# Patient Record
Sex: Male | Born: 1935 | Race: White | Hispanic: No | Marital: Married | Smoking: Former smoker
Health system: Southern US, Community
[De-identification: ages and names within clinical notes are randomized; demographics above are authoritative.]

## PROBLEM LIST (undated history)

## (undated) DIAGNOSIS — N419 Inflammatory disease of prostate, unspecified: Secondary | ICD-10-CM

## (undated) DIAGNOSIS — M109 Gout, unspecified: Secondary | ICD-10-CM

## (undated) DIAGNOSIS — R972 Elevated prostate specific antigen [PSA]: Secondary | ICD-10-CM

## (undated) DIAGNOSIS — I10 Essential (primary) hypertension: Secondary | ICD-10-CM

## (undated) DIAGNOSIS — C61 Malignant neoplasm of prostate: Secondary | ICD-10-CM

## (undated) DIAGNOSIS — M0520 Rheumatoid vasculitis with rheumatoid arthritis of unspecified site: Secondary | ICD-10-CM

## (undated) HISTORY — PX: HEMORROIDECTOMY: SUR656

## (undated) HISTORY — DX: Rheumatoid vasculitis with rheumatoid arthritis of unspecified site: M05.20

## (undated) HISTORY — PX: PROSTATE BIOPSY: SHX241

## (undated) HISTORY — DX: Inflammatory disease of prostate, unspecified: N41.9

## (undated) HISTORY — DX: Gout, unspecified: M10.9

## (undated) HISTORY — DX: Elevated prostate specific antigen (PSA): R97.20

## (undated) HISTORY — DX: Malignant neoplasm of prostate: C61

---

## 2004-06-08 HISTORY — PX: PROSTATE CRYOABLATION: SUR358

## 2005-10-01 ENCOUNTER — Other Ambulatory Visit: Payer: Self-pay

## 2005-10-01 ENCOUNTER — Ambulatory Visit: Payer: Self-pay | Admitting: Urology

## 2005-10-13 ENCOUNTER — Ambulatory Visit: Payer: Self-pay | Admitting: Urology

## 2006-06-07 ENCOUNTER — Ambulatory Visit: Payer: Self-pay | Admitting: Gastroenterology

## 2009-05-07 ENCOUNTER — Ambulatory Visit: Payer: Self-pay | Admitting: Internal Medicine

## 2011-06-03 ENCOUNTER — Ambulatory Visit: Payer: Self-pay | Admitting: Internal Medicine

## 2011-06-21 ENCOUNTER — Emergency Department: Payer: Self-pay | Admitting: *Deleted

## 2011-06-21 LAB — CBC WITH DIFFERENTIAL/PLATELET
Basophil #: 0 10*3/uL (ref 0.0–0.1)
Basophil %: 0.4 %
HGB: 15.6 g/dL (ref 13.0–18.0)
Lymphocyte #: 1.2 10*3/uL (ref 1.0–3.6)
MCH: 33.7 pg (ref 26.0–34.0)
MCV: 98 fL (ref 80–100)
Monocyte %: 6.5 %
Platelet: 177 10*3/uL (ref 150–440)
RBC: 4.64 10*6/uL (ref 4.40–5.90)
RDW: 12.9 % (ref 11.5–14.5)
WBC: 9.6 10*3/uL (ref 3.8–10.6)

## 2011-06-21 LAB — APTT: Activated PTT: 29.2 secs (ref 23.6–35.9)

## 2012-05-04 ENCOUNTER — Ambulatory Visit: Payer: Self-pay | Admitting: Ophthalmology

## 2012-09-30 ENCOUNTER — Ambulatory Visit: Payer: Self-pay | Admitting: Family Medicine

## 2014-04-20 ENCOUNTER — Ambulatory Visit: Payer: Self-pay

## 2014-04-21 ENCOUNTER — Ambulatory Visit: Payer: Self-pay | Admitting: Family Medicine

## 2014-04-21 ENCOUNTER — Ambulatory Visit: Payer: Self-pay

## 2014-04-21 LAB — CLOSTRIDIUM DIFFICILE(ARMC)

## 2014-04-24 LAB — STOOL CULTURE

## 2014-06-27 ENCOUNTER — Ambulatory Visit: Payer: Self-pay | Admitting: Urology

## 2014-09-30 NOTE — Op Note (Signed)
PATIENT NAME:  Jeremiah Lewis, Jeremiah Lewis MR#:  [id number] DATE OF BIRTH:  [birth year]  DATE OF PROCEDURE:  06/21/2011  PREOPERATIVE DIAGNOSIS: Uncontrolled epistaxis.   POSTOPERATIVE DIAGNOSIS: Uncontrolled epistaxis.   OPERATIVE PROCEDURE: Nasal endoscopy and complicated control of epistaxis.   SURGEON: Huey Romans, MD   ANESTHESIA: Topical.   PROCEDURE: The patient was seen in the Emergency Room. He had a Rhino Rocket in his nose and was still draining around the Rhino rocket as well as draining down the back of his throat or coming out the right nostril. The patient was suctioned to try to assess the situation. There didn't appear to be active bleeding from the right side but just a drip is coming out as he leans his head forward. He is actively bleeding down the back of his throat. Rhino Rocket is removed. Suction is used and once I suctioned out all the blood from the left nostril you could see that there is active dripping coming from high in the nose, appears to be the anterior ethmoid running down along the medial side of the middle turbinate and then into the posterior pharynx. Cottonoid pledgets soaked in Afrin and lidocaine are used to pack the top of the nose, back of the nose. I packed this multiple times and finally seemed to get the bleeding settled down some and was not actively dripping anymore. After the third packing is removed, flexible endoscope was used for visualization of the nose. The left side shows old bleeding underneath the inferior turbinate but no active bleeding coming from there at all. The nasopharynx has just blood staining but no bleeding there. I do not see any lesions coming from superiorly in the nose and the groove between the middle turbinate and septum seems to be clear now and there is no active bleeding coming from that area. The right side shows septal spur and deviation of the right side where it is blocking much of the posterior nasal cavity and I am unable get  all the way through the nose on the right side. No sign of polyps or lesions on this side either. A Merocel sponge is then placed in the left nostril all the way to the nasopharynx. The Merocel is then soaked with Afrin and lidocaine to fill up the nasal cavity. This is clear. No sign of active bleeding at this point.    The patient tolerated the procedure well. This was done at the bedside in the Emergency Room. There were no operative complications.  ____________________________ Huey Romans, MD phj:drc D: 06/21/2011 04:12:53 ET T: 06/21/2011 10:57:05 ET JOB#: [id number]  cc: Huey Romans, MD, <Dictator> Huey Romans MD ELECTRONICALLY SIGNED 06/26/2011 20:27

## 2014-09-30 NOTE — Consult Note (Signed)
PATIENT NAME:  Jeremiah Lewis, Jeremiah Lewis MR#:  [id number] DATE OF BIRTH:  [birth year]  DATE OF CONSULTATION:  06/21/2011  REFERRING PHYSICIAN:   CONSULTING PHYSICIAN:  Huey Romans, MD  REASON FOR CONSULTATION: Uncontrolled epistaxis.   HISTORY OF PRESENT ILLNESS: The patient is a 81 year old white male who has been very healthy overall. He has never had any nosebleed problems. He woke up in the middle of the night and blew his nose and started getting bleeding from his left side. He has never had nosebleeds before. He came to the emergency room and this one has just not been able to be controlled well. He has been on a baby aspirin a day, but he is not on any other blood thinners. He says he has a history of some hypertension and his blood pressure was up for a little while but lately he has been checking it and it has been 120 to 130/70 generally at home. He is not on any medications for hypertension. He did have some flu last week, but it seems like that has gone away and he has been doing very well.   PAST MEDICAL HISTORY:  1. History of hypertension, although he has not been on medications and recent blood pressures at home have been stable.  2. History of glaucoma.  CURRENT MEDICATIONS: Baby aspirin a day.   DRUG ALLERGIES: No known drug allergies.  REVIEW OF SYSTEMS: He has not had any problems with bleeding anywhere else. No bruising at all. No swollen glands. No joint pain. No problems with his vision. He is not having any sore throat or cold right now. He has not been having any black stools or problems urinating. No chest pain and no shortness of breath. He has not been dizzy. No anxiety.   SOCIAL HISTORY: He is not a smoker. He lives at home.   FAMILY HISTORY: Negative for any kind of bleeding problems.     PHYSICAL EXAMINATION:   GENERAL: The patient is awake and alert, very cooperative.   HEENT: He has a Rhino Rocket in his left nostril and still actively bleeding around that and  he is spitting some blood out of his mouth. He is also having a little drip out of his right nostril. He has some fresh blood coming down the back of his throat. His oropharynx shows a good set of teeth. There is sign of fresh bleeding down the back of his throat.   NECK:  Negative with no nodes or masses.  HEART: Regular rate and rhythm without murmur.   VITALS: Blood pressure 283 systolic. Respiratory rate is normal.  LUNGS: Clear.   SKIN:  No skin rash.   ABDOMEN: Benign.   EXTREMITIES: Nondeformed.  PROCEDURE: The patient was seen at the bedside and control of the nosebleed was done by using Afrin and 4% lidocaine on cotton pledgets to find the bleeding site superiorly, at the anterior ethmoid, and then using the nasoendoscope to make sure there is no other bleeding sites, this was controlled and then a Merocel sponge was placed to help create some pressure in this area.   IMPRESSION AND RECOMMENDATIONS: The patient has had uncontrolled epistaxis and finally able to get it controlled.  He will be watched here for another hour, in the emergency room, and then sent home. We are awaiting lab work. He will rest at home and followup in the         office on Wednesday afternoon for removal of the sponge. He  will be put on some antibiotics to help prevent infection and he will hold his aspirin for at least the next week.  ____________________________ Huey Romans, MD phj:slb D: 06/21/2011 04:21:26 ET T: 06/21/2011 11:14:32 ET JOB#: [id number]  cc: Huey Romans, MD, <Dictator> Huey Romans MD ELECTRONICALLY SIGNED 06/26/2011 20:28

## 2015-01-23 DIAGNOSIS — R011 Cardiac murmur, unspecified: Secondary | ICD-10-CM | POA: Insufficient documentation

## 2015-01-23 DIAGNOSIS — I10 Essential (primary) hypertension: Secondary | ICD-10-CM | POA: Insufficient documentation

## 2015-04-07 ENCOUNTER — Ambulatory Visit: Payer: Medicare Other

## 2015-04-07 ENCOUNTER — Ambulatory Visit
Admission: EM | Admit: 2015-04-07 | Discharge: 2015-04-07 | Disposition: A | Discharge status: Home / Self Care | Payer: Medicare Other | Attending: Family Medicine | Admitting: Family Medicine

## 2015-04-07 ENCOUNTER — Encounter: Payer: Self-pay | Admitting: Gynecology

## 2015-04-07 ENCOUNTER — Other Ambulatory Visit: Payer: Self-pay

## 2015-04-07 DIAGNOSIS — Z7982 Long term (current) use of aspirin: Secondary | ICD-10-CM | POA: Diagnosis not present

## 2015-04-07 DIAGNOSIS — I10 Essential (primary) hypertension: Secondary | ICD-10-CM | POA: Insufficient documentation

## 2015-04-07 DIAGNOSIS — M25512 Pain in left shoulder: Secondary | ICD-10-CM | POA: Diagnosis present

## 2015-04-07 DIAGNOSIS — R079 Chest pain, unspecified: Secondary | ICD-10-CM

## 2015-04-07 DIAGNOSIS — I440 Atrioventricular block, first degree: Secondary | ICD-10-CM | POA: Insufficient documentation

## 2015-04-07 DIAGNOSIS — I493 Ventricular premature depolarization: Secondary | ICD-10-CM | POA: Diagnosis not present

## 2015-04-07 HISTORY — DX: Essential (primary) hypertension: I10

## 2015-04-07 NOTE — ED Notes (Signed)
Patient stated on and off pain  Started under his left axillary which rotate to his left  Posterior shoulder blade x 3 days.. Patient stated after burping and when walking felt better.

## 2015-04-07 NOTE — ED Provider Notes (Signed)
CSN: [id number]     Arrival date & time 04/07/15  1538 History   First MD Initiated Contact with Patient 04/07/15 1612     Chief Complaint  Patient presents with  . Arm Pain   (Consider location/radiation/quality/duration/timing/severity/associated sxs/prior Treatment) HPI Comments: Married caucasian male here for evaluation of left shoulder blade/armpit/chest pain.  Started a couple days ago thinks related to gas pains because he felt gas moving in belly earlier today took gas X and alkaseltzer and now it is gone and he can't get it to come back with arm movements that worsened pain previous couple of days.  Denied jaw pain, dyspnea, headache, rash, dizzyness, illness, trauma, working in yard  Retired  Blood pressure yesterday 131/67 when he checked  Patient is a 81 y.o. male presenting with arm pain. The history is provided by the patient.  Arm Pain This is a recurrent problem. The current episode started more than 2 days ago. The problem occurs daily. The problem has been resolved. Associated symptoms include chest pain. Pertinent negatives include no abdominal pain, no headaches and no shortness of breath. The symptoms are aggravated by exertion. Relieved by: meds. Treatments tried: alkaseltzer, gas x. The treatment provided significant relief.    Past Medical History  Diagnosis Date  . Hypertension   . Cancer [facility])     prostate   Past Surgical History  Procedure Laterality Date  . Prostate surgery    . Hemorroidectomy     History reviewed. No pertinent family history. Social History  Substance Use Topics  . Smoking status: Never Smoker   . Smokeless tobacco: None  . Alcohol Use: No    Review of Systems  Constitutional: Negative for fever, chills, diaphoresis, activity change, appetite change, fatigue and unexpected weight change.  HENT: Negative for congestion, dental problem, drooling, ear discharge, ear pain, facial swelling, hearing loss, mouth sores, nosebleeds,  postnasal drip, rhinorrhea, sinus pressure, sneezing, sore throat, tinnitus, trouble swallowing and voice change.   Eyes: Negative for photophobia, pain, discharge, redness, itching and visual disturbance.  Respiratory: Negative for cough, choking, chest tightness, shortness of breath, wheezing and stridor.   Cardiovascular: Positive for chest pain. Negative for palpitations and leg swelling.  Gastrointestinal: Negative for nausea, vomiting, abdominal pain, diarrhea, constipation, blood in stool and abdominal distention.  Endocrine: Negative for cold intolerance and heat intolerance.  Genitourinary: Negative for dysuria.  Musculoskeletal: Positive for myalgias and arthralgias. Negative for back pain, joint swelling, gait problem, neck pain and neck stiffness.  Skin: Negative for color change, pallor, rash and wound.  Allergic/Immunologic: Negative for environmental allergies and food allergies.  Neurological: Negative for dizziness, tremors, seizures, syncope, facial asymmetry, speech difficulty, weakness, light-headedness, numbness and headaches.  Hematological: Negative for adenopathy. Does not bruise/bleed easily.  Psychiatric/Behavioral: Positive for sleep disturbance. Negative for behavioral problems, confusion and agitation.    Allergies  Review of patient's allergies indicates no known allergies.  Home Medications   Prior to Admission medications   Medication Sig Start Date End Date Taking? Authorizing Provider  aspirin 81 MG tablet Take 81 mg by mouth daily.   Yes Historical Provider, MD  hydrochlorothiazide (HYDRODIURIL) 25 MG tablet Take 25 mg by mouth daily.   Yes Historical Provider, MD  indomethacin (INDOCIN) 25 MG capsule Take 25 mg by mouth 2 (two) times daily with a meal.   Yes Historical Provider, MD  latanoprost (XALATAN) 0.005 % ophthalmic solution 1 drop at bedtime.   Yes Historical Provider, MD  lisinopril (PRINIVIL,ZESTRIL) 40 MG  tablet Take 40 mg by mouth daily.   Yes  Historical Provider, MD   Meds Ordered and Administered this Visit  Medications - No data to display  BP 155/65 mmHg  Pulse 74  Temp(Src) 98.1 F (36.7 C) (Oral)  Resp 16  Ht 6\' 1"  (1.854 m)  Wt 196 lb (88.905 kg)  BMI 25.86 kg/m2  SpO2 100% No data found.   Physical Exam  Constitutional: He is oriented to person, place, and time. Vital signs are normal. He appears well-developed and well-nourished. He is active and cooperative.  Non-toxic appearance. He does not have a sickly appearance. He does not appear ill. No distress.  HENT:  Head: Normocephalic and atraumatic.  Right Ear: Hearing, tympanic membrane, external ear and ear canal normal.  Left Ear: Hearing, tympanic membrane, external ear and ear canal normal.  Nose: Nose normal. No mucosal edema or rhinorrhea. No epistaxis. Right sinus exhibits no maxillary sinus tenderness and no frontal sinus tenderness. Left sinus exhibits no maxillary sinus tenderness and no frontal sinus tenderness.  Mouth/Throat: Uvula is midline, oropharynx is clear and moist and mucous membranes are normal. Mucous membranes are not pale, not dry and not cyanotic. He does not have dentures. No oral lesions. No trismus in the jaw. Normal dentition. No dental abscesses, uvula swelling, lacerations or dental caries. No oropharyngeal exudate, posterior oropharyngeal edema, posterior oropharyngeal erythema or tonsillar abscesses.  Eyes: EOM and lids are normal. Pupils are equal, round, and reactive to light. Right eye exhibits no chemosis, no discharge, no exudate and no hordeolum. No foreign body present in the right eye. Left eye exhibits no chemosis, no discharge, no exudate and no hordeolum. No foreign body present in the left eye. Right conjunctiva is injected. Right conjunctiva has no hemorrhage. Left conjunctiva is injected. Left conjunctiva has no hemorrhage. No scleral icterus. Right eye exhibits normal extraocular motion and no nystagmus. Left eye exhibits  normal extraocular motion and no nystagmus. Right pupil is round and reactive. Left pupil is round and reactive. Pupils are equal.  Neck: Trachea normal and normal range of motion. Neck supple. No tracheal tenderness, no spinous process tenderness and no muscular tenderness present. No rigidity. No tracheal deviation, no edema, no erythema and normal range of motion present. No thyroid mass and no thyromegaly present.  Cardiovascular: Normal rate, regular rhythm, S1 normal, S2 normal, normal heart sounds and intact distal pulses.  PMI is not displaced.  Exam reveals no gallop and no friction rub.   No murmur heard. Pulses:      Radial pulses are 2+ on the right side, and 2+ on the left side.  Pulmonary/Chest: Effort normal and breath sounds normal. No accessory muscle usage or stridor. No respiratory distress. He has no decreased breath sounds. He has no wheezes. He has no rhonchi. He has no rales. He exhibits no tenderness.  Abdominal: Soft. Bowel sounds are normal. He exhibits no shifting dullness, no distension, no pulsatile liver, no fluid wave, no abdominal bruit, no ascites, no pulsatile midline mass and no mass. There is no hepatosplenomegaly. There is no tenderness. There is no rebound and no guarding. Hernia confirmed negative in the ventral area.  Musculoskeletal: He exhibits no edema or tenderness.       Right shoulder: He exhibits decreased range of motion and pain. He exhibits no tenderness, no bony tenderness, no swelling, no effusion, no crepitus, no deformity, no laceration, no spasm, normal pulse and normal strength.       Left shoulder:  Normal.       Right elbow: Normal.      Left elbow: Normal.       Right wrist: Normal.       Left wrist: Normal.       Right hip: Normal.       Left hip: Normal.       Right knee: Normal.       Left knee: Normal.       Right ankle: Normal.       Left ankle: Normal.       Cervical back: Normal.       Thoracic back: Normal.       Lumbar back:  Normal.       Right hand: Normal.       Left hand: Normal.  Lymphadenopathy:       Head (right side): No submental, no submandibular, no tonsillar, no preauricular, no posterior auricular and no occipital adenopathy present.       Head (left side): No submental, no submandibular, no tonsillar, no preauricular, no posterior auricular and no occipital adenopathy present.    He has no cervical adenopathy.       Right cervical: No superficial cervical, no deep cervical and no posterior cervical adenopathy present.      Left cervical: No superficial cervical, no deep cervical and no posterior cervical adenopathy present.  Neurological: He is alert and oriented to person, place, and time. He displays no atrophy and no tremor. No cranial nerve deficit or sensory deficit. He exhibits normal muscle tone. He displays no seizure activity. Coordination and gait normal. GCS eye subscore is 4. GCS verbal subscore is 5. GCS motor subscore is 6.  Reflex Scores:      Patellar reflexes are 2+ on the right side and 2+ on the left side. Skin: Skin is warm, dry and intact. No abrasion, no bruising, no burn, no ecchymosis, no laceration, no lesion, no petechiae and no rash noted. He is not diaphoretic. No cyanosis or erythema. No pallor. Nails show no clubbing.  Psychiatric: He has a normal mood and affect. His speech is normal and behavior is normal. Judgment and thought content normal. Cognition and memory are normal.  Nursing note and vitals reviewed.   ED Course  .EKG  Date/Time: 04/07/2015 4:26 PM Performed by: Gerarda Fraction A Authorized by: Norval Gable Rhythm: sinus rhythm and A-V block Ectopy: PVCs Rate: normal QRS axis: normal Conduction: 1st degree ST Segments: ST segments normal T Waves: T waves normal Other: no other findings Clinical impression: abnormal ECG Comments: Vent rate 63bpm PR interval 263ms QRS duration 102 ms QT/QTc 422/440ms PRT axes -10 -12 63 Currently asymptomatic  history left axilla posterior shoulder pain yesterday, last night and this am that resolved spontaneously prior to arrival VSS   (including critical care time)  Labs Review Labs Reviewed - No data to display  Imaging Review Dg Chest 2 View  04/07/2015  CLINICAL DATA:  Left shoulder pain for 3 days. History of prostate cancer. EXAM: CHEST  2 VIEW COMPARISON:  None. FINDINGS: The heart size and mediastinal contours are within normal limits. Both lungs are clear. No acute osseous abnormality. Degenerative changes noted within the thoracic spine, mild to moderate in degree. IMPRESSION: No evidence of acute cardiopulmonary abnormality. No acute osseous abnormality or evidence of osseous metastasis seen. Electronically Signed   By: Franki Cabot M.D.   On: 04/07/2015 16:53    1700 Continue medications as prescribed by PCM.  Discussed chest  xray results normal and abnormal EKG results with patient and given copy of reports.  Patient verbalized understanding of information/instructions, agreed with plan of care and had no further questions at this time.   MDM   1. First degree heart block by electrocardiogram   2. PVC's (premature ventricular contractions)    Case discussed with Dr Zenda Alpers.  Patient to follow up with [facility] tomorrow and consider cardiology for HOLTER monitor.  ER tonight if chest pain returns, dyspnea, shortness of breath, nausea or vomiting, dizzyness.  First degree AV block with frequent PVCs on EKG today.  See outpatient record for completed copy and cardiology service interpretation.  Exitcare handouts on Holter monitor, PVCs and first degree AV block given to patient.  Patient verbalized agreement and understanding of treatment plan and had no further questions at this time.   Olen Cordial, NP 04/07/15 1711

## 2015-04-07 NOTE — Discharge Instructions (Signed)
Holter Monitoring A Holter monitor is a small device that is used to detect abnormal heart rhythms. It clips to your clothing and is connected by wires to flat, sticky disks (electrodes) that attach to your chest. It is worn continuously for 24-48 hours. HOME CARE INSTRUCTIONS  Wear your Holter monitor at all times, even while exercising and sleeping, for as long as directed by your health care provider.  Make sure that the Holter monitor is safely clipped to your clothing or close to your body as recommended by your health care provider.  Do not get the monitor or wires wet.  Do not put body lotion or moisturizer on your chest.  Keep your skin clean.  Keep a diary of your daily activities, such as walking and doing chores. If you feel that your heartbeat is abnormal or that your heart is fluttering or skipping a beat:  Record what you are doing when it happens.  Record what time of day the symptoms occur.  Return your Holter monitor as directed by your health care provider.  Keep all follow-up visits as directed by your health care provider. This is important. SEEK IMMEDIATE MEDICAL CARE IF:  You feel lightheaded or you faint.  You have trouble breathing.  You feel pain in your chest, upper arm, or jaw.  You feel sick to your stomach and your skin is pale, cool, or damp.  You heartbeat feels unusual or abnormal.   This information is not intended to replace advice given to you by your health care provider. Make sure you discuss any questions you have with your health care provider.   Document Released: 02/21/2004 Document Revised: 06/15/2014 Document Reviewed: 01/01/2014 Elsevier Interactive Patient Education 2016 [employer].  First-Degree Atrioventricular Block First-degree atrioventricular (AV) block is a type of heart block. About Heart Block Heart block is a problem with the system that controls how often the heart beats (heart rate). If you have heart block, the  electrical signals that regulate your heart rate are slowed or interrupted. Normal Heart Action The heart has two upper chambers (atria) and two lower chambers (ventricles). They work together to pump blood to the body. The heartbeat starts in an upper area of the right atrium (sinoatrial node, or SA node). This is the heart's natural pacemaker. The SA node sends electrical signals that pass through another node (atrioventricular node, or AV node), which is located between the atria and ventricles. Next, the signals travel through the ventricles on conduction pathways. As the signals pass down these pathways, the ventricles contract and send blood out to the body. About First-Degree AV Block First-degree heart block is the least serious type of AV block. If you have first-degree AV block, the signals travel more slowly through your heart. This can cause your heart to beat more slowly than normal, but your heart does not miss any beats. Treatment is usually not needed, but the condition may increase your risk of developing an irregular heart rhythm (atrial fibrillation). CAUSES  In some cases, a person is born with heart block. More often, the condition develops over time. First-degree heart block may be caused by:  Any condition that damages the heart's conducting system.  Overstimulation of a nerve that slows down the heart (vagus nerve). This is common in well-conditioned athletes.  Some medicines that slow down the heart rate. RISK FACTORS The risk for this condition increases with age. It is also more likely to develop in people who have any of the following  conditions:  A heart attack in the past.  Heart failure.  Coronary heart disease.  Inflammation of heart muscle (myocarditis).  Disease of heart muscle (cardiomyopathy).  Infection of the heart valves (endocarditis).  Infections or diseases that affect the heart. These include:  Lyme  disease.  Sarcoidosis.  Hemochromatosis.  Rheumatic fever. SYMPTOMS This condition usually does not cause any symptoms. DIAGNOSIS This condition may be diagnosed during a routine physical exam. Your health care provider may suspect a heart block if you have a slow pulse or heartbeat. You may have tests to confirm the diagnosis and to rule out other conditions. These tests may include:  An electrocardiogram (ECG) to check for problems with the electrical activity in your heart.  Wearing a portable ECG device for a few days (Holter monitor) or a few weeks (event monitor). This is done to monitor your heart rate over time. TREATMENT Usually, treatment is not needed for this condition. You may get treatment for another condition that is causing heart block. You may also need to change or stop taking any heart medicines that could cause heart block. HOME CARE INSTRUCTIONS  Take over-the-counter and prescription medicines only as told by your health care provider.  Work with your health care providers to control all of your risks for heart disease. This may include following these instructions:  Get regular exercise. Ask your health care provider what type of exercise is safe for you.  Eat a heart-healthy diet. Your health care provider or dietitian can help you make healthy choices.  Maintain a healthy weight.  Do not use any tobacco products, including cigarettes, chewing tobacco, or e-cigarettes. If you need help quitting, ask your health care provider.  Limit alcohol intake to no more than 1 drink per day for nonpregnant women and 2 drinks per day for men. One drink equals 12 oz of beer, 5 oz of wine, or 1 oz of hard liquor.  Keep all follow-up visits as told by your health care provider. This is important. SEEK MEDICAL CARE IF:  You feel as though your heart is skipping beats.  You feel more tired than normal. SEEK IMMEDIATE MEDICAL CARE IF:  You have chest pain, especially  if the pain:  Feels like crushing or pressure.  Spreads to your arms, back, neck, or jaw.  You feel short of breath.  You feel light-headed or weak.  You faint.   This information is not intended to replace advice given to you by your health care provider. Make sure you discuss any questions you have with your health care provider.   Document Released: 05/07/2008 Document Revised: 10/09/2014 Document Reviewed: 04/25/2014 Elsevier Interactive Patient Education 2016 Elsevier Inc[employer] Premature Ventricular Contraction A premature ventricular contraction is an irregularity in the normal heart rhythm. These contractions are extra heartbeats that occur too early in the normal sequence. In most cases, these contractions are harmless and do not require treatment. CAUSES Premature ventricular contractions may occur without a known cause. In healthy people, the extra contractions may be caused by:  Smoking.  Drinking alcohol.  Caffeine.  Certain medicines.  Some illegal drugs.  Stress. Sometimes, changes in chemicals in the blood (electrolytes) can also cause premature ventricular contractions. They can also occur in people with heart diseases that cause a decrease in blood flow to the heart. SIGNS AND SYMPTOMS Premature ventricular contractions often do not cause any symptoms. In some cases, you may have a feeling of your heart beating fast or skipping a beat (palpitations).  DIAGNOSIS Your health care provider will take your medical history and do a physical exam. During the exam, the health care provider will check for irregular heartbeats. Various tests may be done to help diagnose premature ventricular contractions. These tests may include:  An ECG (electrocardiogram) to monitor the electrical activity of your heart.  Holter monitor testing. A Holter monitor is a portable device that can monitor the electrical activity of your heart over longer periods of time.  Stress tests to see  how exercise affects your heart rhythm.  Echocardiogram. This test uses sound waves (ultrasound) to produce an image of your heart.  Electrophysiology study. This is used to evaluate the electrical conduction system of your heart. TREATMENT Usually, no treatment is needed. You may be advised to avoid things that can trigger the premature contractions, such as caffeine or alcohol. Medicines are sometimes given if symptoms are severe or if the extra heartbeats are very frequent. Treatment may also be needed for an underlying cause of the contractions if one is found. HOME CARE INSTRUCTIONS  Take medicines only as directed by your health care provider.  Make any lifestyle changes recommended by your health care provider. These may include:  Quitting smoking.  Avoiding or limiting caffeine or alcohol.  Exercising. Talk to your health care provider about what type of exercise is safe for you.  Trying to reduce stress.  Keep all follow-up visits with your health care provider. This is important. SEEK IMMEDIATE MEDICAL CARE IF:  You feel palpitations that are frequent or continual.  You have chest pain.  You have shortness of breath.  You have sweating for no reason.  You have nausea and vomiting.  You become light-headed or faint.   This information is not intended to replace advice given to you by your health care provider. Make sure you discuss any questions you have with your health care provider.   Document Released: 01/10/2004 Document Revised: 06/15/2014 Document Reviewed: 10/26/2013 Elsevier Interactive Patient Education [employer].

## 2015-04-08 ENCOUNTER — Ambulatory Visit: Payer: Self-pay | Admitting: Obstetrics and Gynecology

## 2015-04-20 ENCOUNTER — Emergency Department
Admission: EM | Admit: 2015-04-20 | Discharge: 2015-04-20 | Disposition: A | Discharge status: Home / Self Care | Payer: Medicare Other | Attending: Emergency Medicine | Admitting: Emergency Medicine

## 2015-04-20 DIAGNOSIS — M79602 Pain in left arm: Secondary | ICD-10-CM | POA: Diagnosis present

## 2015-04-20 DIAGNOSIS — Z791 Long term (current) use of non-steroidal anti-inflammatories (NSAID): Secondary | ICD-10-CM | POA: Insufficient documentation

## 2015-04-20 DIAGNOSIS — Z87891 Personal history of nicotine dependence: Secondary | ICD-10-CM | POA: Insufficient documentation

## 2015-04-20 DIAGNOSIS — I10 Essential (primary) hypertension: Secondary | ICD-10-CM | POA: Insufficient documentation

## 2015-04-20 DIAGNOSIS — Z7982 Long term (current) use of aspirin: Secondary | ICD-10-CM | POA: Insufficient documentation

## 2015-04-20 DIAGNOSIS — Z79899 Other long term (current) drug therapy: Secondary | ICD-10-CM | POA: Insufficient documentation

## 2015-04-20 DIAGNOSIS — M5410 Radiculopathy, site unspecified: Secondary | ICD-10-CM | POA: Insufficient documentation

## 2015-04-20 DIAGNOSIS — M792 Neuralgia and neuritis, unspecified: Secondary | ICD-10-CM

## 2015-04-20 LAB — CBC
HEMATOCRIT: 45.7 % (ref 40.0–52.0)
Hemoglobin: 15.9 g/dL (ref 13.0–18.0)
MCH: 33.2 pg (ref 26.0–34.0)
MCHC: 34.8 g/dL (ref 32.0–36.0)
MCV: 95.5 fL (ref 80.0–100.0)
Platelets: 158 10*3/uL (ref 150–440)
RBC: 4.79 MIL/uL (ref 4.40–5.90)
RDW: 13.2 % (ref 11.5–14.5)
WBC: 7.1 10*3/uL (ref 3.8–10.6)

## 2015-04-20 LAB — BASIC METABOLIC PANEL
Anion gap: 6 (ref 5–15)
BUN: 17 mg/dL (ref 6–20)
CALCIUM: 9.8 mg/dL (ref 8.9–10.3)
CHLORIDE: 103 mmol/L (ref 101–111)
CO2: 28 mmol/L (ref 22–32)
CREATININE: 1.22 mg/dL (ref 0.61–1.24)
GFR calc non Af Amer: 53 mL/min — ABNORMAL LOW (ref >60)
Glucose, Bld: 110 mg/dL — ABNORMAL HIGH (ref 65–99)
Potassium: 4.7 mmol/L (ref 3.5–5.1)
SODIUM: 137 mmol/L (ref 135–145)

## 2015-04-20 LAB — TROPONIN I

## 2015-04-20 NOTE — ED Notes (Signed)
Pt presents with c/o pain in left shoulder/ axilla x 2 weeks.  Reports that he started having a tingling sensation in the pinky finger yesterday.  Pt says that he was evaluated at urgent care and by PCP and was placed on motrin,  Pt says that the shoulder pain has improved but he continues to have mild pain under the arm and now the tingling in his finger.   Denies injury

## 2015-04-20 NOTE — ED Provider Notes (Signed)
[facility] Emergency Department Provider Note REMINDER - THIS NOTE IS NOT A FINAL MEDICAL RECORD UNTIL IT IS SIGNED. UNTIL THEN, THE CONTENT BELOW MAY REFLECT INFORMATION FROM A DOCUMENTATION TEMPLATE, NOT THE ACTUAL PATIENT VISIT. ____________________________________________  Time seen: Approximately 7:57 AM  I have reviewed the triage vital signs and the nursing notes.   HISTORY  Chief Complaint Arm Pain    HPI Jeremiah Lewis is a 81 y.o. male . History of hypertension and cancer.  [name] comes for evaluation today of about 2 weeks of off and on pain in his left upper arm, at times also having some slight tingling in the fourth and fifth fingers on the left hand without any weakness.  Denies having any neck pain and is not having any chest pain. Last week he was experiencing some discomfort under the left shoulder blade, however this has resolved. He saw both urgent care as well as his primary care doctor, both of whom did EKGs and had a chest x-ray done at the urgent care.  He denies that his symptoms are worsened by anything except resting in the chair. He notes that if he gets up and works outside performs activities his pain is a goes away. He has not had a nausea, vomiting, sweats or abdominal pain. No pain radiating to his neck or other concerns.  He quit smoking over 45 years ago.Denies any known problems with his heart except for occasional skipping beats.  At the present time he denies having had any pain since last evening. He reports that taking 600 mg ibuprofen at home has essentially eliminated his pain will come back after about 8 or 12 hours of taking Motrin.  Describes a sharp and achy pain that shoots from the inner portion of the left upper arm down towards the left hand off and on.  Past Medical History  Diagnosis Date  . Hypertension   . Cancer [facility])     prostate    There are no active problems to display for this  patient.   Past Surgical History  Procedure Laterality Date  . Prostate surgery    . Hemorroidectomy      Current Outpatient Rx  Name  Route  Sig  Dispense  Refill  . aspirin 81 MG tablet   Oral   Take 81 mg by mouth daily.         . hydrochlorothiazide (HYDRODIURIL) 25 MG tablet   Oral   Take 25 mg by mouth daily.         . indomethacin (INDOCIN) 25 MG capsule   Oral   Take 25 mg by mouth 2 (two) times daily with a meal.         . latanoprost (XALATAN) 0.005 % ophthalmic solution      1 drop at bedtime.         Marland Kitchen lisinopril (PRINIVIL,ZESTRIL) 40 MG tablet   Oral   Take 40 mg by mouth daily.           Allergies Review of patient's allergies indicates no known allergies.  No family history on file.  Social History Social History  Substance Use Topics  . Smoking status: Former Research scientist (life sciences)  . Smokeless tobacco: None  . Alcohol Use: Yes     Comment: beer on most evenings    Review of Systems Constitutional: No fever/chills Eyes: No visual changes. ENT: No sore throat. Cardiovascular: Denies chest pain. Respiratory: Denies shortness of breath. Gastrointestinal: No abdominal pain.  No nausea, no vomiting.  No diarrhea.  No constipation. Genitourinary: Negative for dysuria. Musculoskeletal: Negative for back pain. Skin: Negative for rash. Neurological: Negative for headaches, focal weakness or numbness except for some mild tingling in the left hand fourth and fifth fingers.  10-point ROS otherwise negative.  ____________________________________________   PHYSICAL EXAM:  VITAL SIGNS: ED Triage Vitals  Enc Vitals Group     BP 04/20/15 0640 169/51 mmHg     Pulse Rate 04/20/15 0640 65     Resp 04/20/15 0640 16     Temp 04/20/15 0640 98 F (36.7 C)     Temp Source 04/20/15 0640 Oral     SpO2 04/20/15 0640 97 %     Weight 04/20/15 0640 204 lb (92.534 kg)     Height 04/20/15 0640 6\' 2"  (1.88 m)     Head Cir --      Peak Flow --      Pain Score  04/20/15 0641 6     Pain Loc --      Pain Edu? --      Excl. in [place]? --    Constitutional: Alert and oriented. Well appearing and in no acute distress. Eyes: Conjunctivae are normal. PERRL. EOMI. Head: Atraumatic. Nose: No congestion/rhinnorhea. Mouth/Throat: Mucous membranes are moist.  Oropharynx non-erythematous. Neck: No stridor.  No midline cervical tenderness. There is no worsening or change in discomfort with moving the neck. Cardiovascular: Normal rate, regular rhythm. Grossly normal heart sounds.  Good peripheral circulation. Respiratory: Normal respiratory effort.  No retractions. Lungs CTAB. Gastrointestinal: Soft and nontender. No distention. No abdominal bruits. No CVA tenderness. Musculoskeletal: No lower extremity tenderness nor edema.  No joint effusions. Neurologic:  Normal speech and language. No gross focal neurologic deficits are appreciated. Median ulnar and radial motor sensory exam of the left hand normal. No gait instability. Skin:  Skin is warm, dry and intact. No rash noted. Psychiatric: Mood and affect are normal. Speech and behavior are normal.  ____________________________________________   LABS (all labs ordered are listed, but only abnormal results are displayed)  Labs Reviewed  BASIC METABOLIC PANEL - Abnormal; Notable for the following:    Glucose, Bld 110 (*)    GFR calc non Af Amer 53 (*)    All other components within normal limits  TROPONIN I  CBC   ____________________________________________  EKG  Reviewed and interpreted by me Heart rate 66 QTc 4:30 PR 216 Normal sinus rhythm with first-degree AV block There are frequent PVCs versus ventricular trigeminy noted No ischemic T-wave abnormalities ____________________________________________  RADIOLOGY  We discussed performing a repeat chest x-ray, but the patient reports he had one done at urgent care and currently has no symptoms. After discussion I think it is reasonable given his  recent x-ray for the same symptoms that we do not need to repeat. X-ray urgent care was essentially normal. ____________________________________________   PROCEDURES  Procedure(s) performed: None  Critical Care performed: No  ____________________________________________   INITIAL IMPRESSION / ASSESSMENT AND PLAN / ED COURSE  Pertinent labs & imaging results that were available during my care of the patient were reviewed by me and considered in my medical decision making (see chart for details).  Patient presents for evaluation of left arm pain. Based on what he describes it appears most likely either some sort of radicular pain or impingement along the left side, possibly involving the left ulnar nerve without evidence of acute neurosensory deficit. He does not have significant risk factors coronary disease  aside from hypertension, distant history of smoking. His EKG is quite reassuring that he has multiple PVCs noted.  He is currently asymptomatic. At this point, I believe it is reasonable to proceed with obtaining a troponin. This is normal, I would feel quite confident that this is unlikely to be acute cardiac in nature and would recommend outpatient follow-up with his primary care doctor again which he has on Tuesday.  No signs or symptoms suggest acute aortic dissection, pulmonary embolism, pneumothorax, or acute coronary syndrome.  ----------------------------------------- 9:21 AM on 04/20/2015 -----------------------------------------  Patient remains pain free at this time. Fully awake and alert in no distress. His lab work is reassuring clear normal troponin. We'll discharge him home, plan of care is to follow-up with his primary care doctor Tuesday. I did discuss very careful return precautions including he should come back right away if he develops any chest pain, nausea, vomiting, sweats or fevers, or severe worsening of pain numbness or tingling. Patient and wife both very  agreeable. ____________________________________________   FINAL CLINICAL IMPRESSION(S) / ED DIAGNOSES  Final diagnoses:  Radicular pain in left arm      Delman Kitten, MD 04/20/15 [phone]

## 2015-05-22 ENCOUNTER — Ambulatory Visit (INDEPENDENT_AMBULATORY_CARE_PROVIDER_SITE_OTHER): Payer: Medicare Other | Admitting: Urology

## 2015-05-22 ENCOUNTER — Other Ambulatory Visit: Payer: Self-pay

## 2015-05-22 ENCOUNTER — Encounter: Payer: Self-pay | Admitting: Urology

## 2015-05-22 VITALS — BP 183/66 | HR 41 | Ht 74.0 in | Wt 201.4 lb

## 2015-05-22 DIAGNOSIS — C61 Malignant neoplasm of prostate: Secondary | ICD-10-CM

## 2015-05-22 LAB — URINALYSIS, COMPLETE
Bilirubin, UA: NEGATIVE
GLUCOSE, UA: NEGATIVE
KETONES UA: NEGATIVE
NITRITE UA: NEGATIVE
PROTEIN UA: NEGATIVE
SPEC GRAV UA: 1.015 (ref 1.005–1.030)
Urobilinogen, Ur: 0.2 mg/dL (ref 0.2–1.0)
pH, UA: 5.5 (ref 5.0–7.5)

## 2015-05-22 LAB — MICROSCOPIC EXAMINATION
RBC, UA: NONE SEEN /hpf (ref 0–?)
WBC, UA: >30 /hpf — ABNORMAL HIGH (ref 0–?)

## 2015-05-22 NOTE — Progress Notes (Signed)
05/22/2015 9:43 AM   Jeremiah Lewis 03/15/1931 [id number]  Referring provider: Juanell Fairly, MD [place], [place] [address]  Chief Complaint  Patient presents with  . Prostate Cancer    HPI: The patient is a 81 year old gentleman who has a past medical history of prostate cancer status post cryoablation in 2006.  He was last seen in our office in February 2016.  His PSA was 1.7 at that time. His last previously documented PSAs prior to February 2016 were in 2012 and 2013 which were 1.1 and 1.0 respectively. According to our office records, he had a  CT and bone scan around that time which was negative. He has no new complaints at this visit. He does note a weak stream but is not bothersome to him. He denies any urinary symptoms. He has no new complaints since his last visit.   PMH: Past Medical History  Diagnosis Date  . Hypertension   . Prostate cancer [facility])     prostate  . Prostatitis   . Gout   . Rheumatoid arteritis   . Hypertension   . Elevated PSA     Surgical History: Past Surgical History  Procedure Laterality Date  . Prostate cryoablation  2006  . Hemorroidectomy    . Prostate biopsy      Home Medications:    Medication List       This list is accurate as of: 05/22/15  9:43 AM.  Always use your most recent med list.               aspirin 81 MG tablet  Take 81 mg by mouth daily.     hydrochlorothiazide 12.5 MG capsule  Commonly known as:  MICROZIDE     ibuprofen 600 MG tablet  Commonly known as:  ADVIL,MOTRIN  Take 600 mg by mouth. Reported on 05/22/2015     latanoprost 0.005 % ophthalmic solution  Commonly known as:  XALATAN  1 drop at bedtime.     lisinopril 40 MG tablet  Commonly known as:  PRINIVIL,ZESTRIL  Take 40 mg by mouth daily.        Allergies: No Known Allergies  Family History: Family History  Problem Relation Age of Onset  . Hypertension    . Prostate cancer Neg Hx   . Bladder  Cancer Neg Hx     Social History:  reports that he has quit smoking. He does not have any smokeless tobacco history on file. He reports that he drinks alcohol. He reports that he does not use illicit drugs.  ROS: UROLOGY Frequent Urination?: No Hard to postpone urination?: No Burning/pain with urination?: No Get up at night to urinate?: No Leakage of urine?: No Urine stream starts and stops?: No Trouble starting stream?: No Do you have to strain to urinate?: No Blood in urine?: No Urinary tract infection?: No Sexually transmitted disease?: No Injury to kidneys or bladder?: No Painful intercourse?: No Weak stream?: Yes Erection problems?: No Penile pain?: No  Gastrointestinal Nausea?: No Vomiting?: No Indigestion/heartburn?: No Diarrhea?: No Constipation?: No  Constitutional Fever: No Night sweats?: No Weight loss?: No Fatigue?: No  Skin Skin rash/lesions?: No Itching?: No  Eyes Blurred vision?: No Double vision?: No  Ears/Nose/Throat Sore throat?: No Sinus problems?: No  Hematologic/Lymphatic Swollen glands?: No Easy bruising?: No  Cardiovascular Leg swelling?: No Chest pain?: No  Respiratory Cough?: No Shortness of breath?: No  Endocrine Excessive thirst?: No  Musculoskeletal Back pain?: No Joint  pain?: No  Neurological Headaches?: No Dizziness?: No  Psychologic Depression?: No Anxiety?: No  Physical Exam: BP 183/66 mmHg  Pulse 41  Ht 6\' 2"  (1.88 m)  Wt 201 lb 6.4 oz (91.354 kg)  BMI 25.85 kg/m2  Constitutional:  Alert and oriented, No acute distress. HEENT: [place] AT, moist mucus membranes.  Trachea midline, no masses. Cardiovascular: No clubbing, cyanosis, or edema. Respiratory: Normal respiratory effort, no increased work of breathing. GI: Abdomen is soft, nontender, nondistended, no abdominal masses GU: No CVA tenderness.  Skin: No rashes, bruises or suspicious lesions. Lymph: No cervical or inguinal adenopathy. Neurologic:  Grossly intact, no focal deficits, moving all 4 extremities. Psychiatric: Normal mood and affect.  Laboratory Data: Lab Results  Component Value Date   WBC 7.1 04/20/2015   HGB 15.9 04/20/2015   HCT 45.7 04/20/2015   MCV 95.5 04/20/2015   PLT 158 04/20/2015    Lab Results  Component Value Date   CREATININE 1.22 04/20/2015    No results found for: PSA  No results found for: TESTOSTERONE  No results found for: HGBA1C  Urinalysis No results found for: COLORURINE, APPEARANCEUR, LABSPEC, PHURINE, GLUCOSEU, HGBUR, BILIRUBINUR, KETONESUR, PROTEINUR, UROBILINOGEN, NITRITE, LEUKOCYTESUR   Assessment & Plan:    1. Prostate cancer status cryoablation in 2006 We will check his PSA today. Assuming it is normal he will follow-up in 6 months with a PSA 1 week prior.   Return in about 6 months (around 11/20/2015) for wih psa one week prior.  Nickie Retort, MD  [facility] [address], [place], [place] [address] [phone]

## 2015-05-23 LAB — PSA: Prostate Specific Ag, Serum: 2.3 ng/mL (ref 0.0–4.0)

## 2015-11-13 ENCOUNTER — Other Ambulatory Visit: Payer: Medicare Other

## 2015-11-13 DIAGNOSIS — C61 Malignant neoplasm of prostate: Secondary | ICD-10-CM

## 2015-11-14 LAB — PSA: Prostate Specific Ag, Serum: 1.9 ng/mL (ref 0.0–4.0)

## 2015-11-20 ENCOUNTER — Ambulatory Visit: Payer: Medicare Other

## 2015-11-28 ENCOUNTER — Telehealth: Payer: Self-pay | Admitting: Urology

## 2015-11-28 ENCOUNTER — Encounter: Payer: Self-pay | Admitting: Urology

## 2015-11-28 ENCOUNTER — Ambulatory Visit (INDEPENDENT_AMBULATORY_CARE_PROVIDER_SITE_OTHER): Payer: Medicare Other | Admitting: Urology

## 2015-11-28 VITALS — BP 146/63 | HR 72 | Ht 72.0 in | Wt 206.0 lb

## 2015-11-28 DIAGNOSIS — Z8546 Personal history of malignant neoplasm of prostate: Secondary | ICD-10-CM | POA: Diagnosis not present

## 2015-11-28 NOTE — Progress Notes (Signed)
11/28/2015 12:18 PM   Jeremiah Lewis 06-13-1930 [id number]  Referring provider: Juanell Fairly, MD [address] [place], [place] [address]  No chief complaint on file.   HPI: The patient is a 82 year old gentleman who has a past medical history of prostate cancer status post cryoablation in 2006. He was last seen in our office in February 2016. His PSA was 1.7 at that time. His last previously documented PSAs prior to February 2016 were in 2012 and 2013 which were 1.1 and 1.0 respectively. According to our office records, he had a CT and bone scan around that time which was negative. He has no new complaints at this visit. He does note a weak stream but is not bothersome to him. He denies any urinary symptoms. He has no new complaints since his last visit.    His PSA in June 2017 was 1.9 which is down from 2.3 in December 2016.   PMH: Past Medical History  Diagnosis Date  . Hypertension   . Prostate cancer [facility])     prostate  . Prostatitis   . Gout   . Rheumatoid arteritis   . Hypertension   . Elevated PSA     Surgical History: Past Surgical History  Procedure Laterality Date  . Prostate cryoablation  2006  . Hemorroidectomy    . Prostate biopsy      Home Medications:    Medication List       This list is accurate as of: 11/28/15 12:18 PM.  Always use your most recent med list.               aspirin 81 MG tablet  Take 81 mg by mouth daily.     hydrochlorothiazide 12.5 MG capsule  Commonly known as:  MICROZIDE     latanoprost 0.005 % ophthalmic solution  Commonly known as:  XALATAN  1 drop at bedtime.     lisinopril 40 MG tablet  Commonly known as:  PRINIVIL,ZESTRIL  Take 40 mg by mouth daily.        Allergies: No Known Allergies  Family History: Family History  Problem Relation Age of Onset  . Hypertension    . Prostate cancer Neg Hx   . Bladder Cancer Neg Hx     Social History:  reports that he has quit smoking. He does not  have any smokeless tobacco history on file. He reports that he drinks alcohol. He reports that he does not use illicit drugs.  ROS: UROLOGY Frequent Urination?: No Hard to postpone urination?: No Burning/pain with urination?: No Get up at night to urinate?: No Leakage of urine?: No Urine stream starts and stops?: No Trouble starting stream?: No Do you have to strain to urinate?: No Blood in urine?: No Urinary tract infection?: No Sexually transmitted disease?: No Injury to kidneys or bladder?: No Painful intercourse?: No Weak stream?: No Erection problems?: No Penile pain?: No  Gastrointestinal Nausea?: No Vomiting?: No Indigestion/heartburn?: No Diarrhea?: No Constipation?: No  Constitutional Fever: No Night sweats?: No Weight loss?: No Fatigue?: No  Skin Skin rash/lesions?: No Itching?: No  Eyes Blurred vision?: No Double vision?: No  Ears/Nose/Throat Sore throat?: No Sinus problems?: No  Hematologic/Lymphatic Swollen glands?: No Easy bruising?: No  Cardiovascular Leg swelling?: No Chest pain?: No  Respiratory Cough?: No Shortness of breath?: No  Endocrine Excessive thirst?: No  Musculoskeletal Back pain?: No Joint pain?: No  Neurological Headaches?: No Dizziness?: No  Psychologic Depression?: No Anxiety?: No  Physical  Exam: BP 146/63 mmHg  Pulse 72  Ht 6' (1.829 m)  Wt 206 lb (93.441 kg)  BMI 27.93 kg/m2  Constitutional:  Alert and oriented, No acute distress. HEENT: [place] AT, moist mucus membranes.  Trachea midline, no masses. Cardiovascular: No clubbing, cyanosis, or edema. Respiratory: Normal respiratory effort, no increased work of breathing. GI: Abdomen is soft, nontender, nondistended, no abdominal masses GU: No CVA tenderness.  Skin: No rashes, bruises or suspicious lesions. Lymph: No cervical or inguinal adenopathy. Neurologic: Grossly intact, no focal deficits, moving all 4 extremities. Psychiatric: Normal mood and  affect.  Laboratory Data: Lab Results  Component Value Date   WBC 7.1 04/20/2015   HGB 15.9 04/20/2015   HCT 45.7 04/20/2015   MCV 95.5 04/20/2015   PLT 158 04/20/2015    Lab Results  Component Value Date   CREATININE 1.22 04/20/2015    No results found for: PSA  No results found for: TESTOSTERONE  No results found for: HGBA1C  Urinalysis    Component Value Date/Time   APPEARANCEUR Cloudy* 05/22/2015 0926   GLUCOSEU Negative 05/22/2015 0926   BILIRUBINUR Negative 05/22/2015 0926   PROTEINUR Negative 05/22/2015 0926   NITRITE Negative 05/22/2015 0926   LEUKOCYTESUR 3+* 05/22/2015 0926     Assessment & Plan:    .1. Prostate cancer status cryoablation in 2006 PSA stable. Patient will follow up in one year with a PSA prior  Return in about 1 year (around 11/27/2016) for with psa one week prior.  Nickie Retort, MD  [facility] [address], [place], [place] [address] [phone]

## 2015-11-28 NOTE — Telephone Encounter (Signed)
Patient did not want to schedule a follow up appt today. He said he would call back later to schedule his appts.    Sharyn Lull

## 2017-01-14 IMAGING — CR DG CHEST 2V
2 series · 3 of 3 positions shown · non-contrast
Comparison: None.

CLINICAL DATA: Left shoulder pain for 3 days. History of prostate
cancer.

EXAM:
CHEST  2 VIEW

[chest pa]
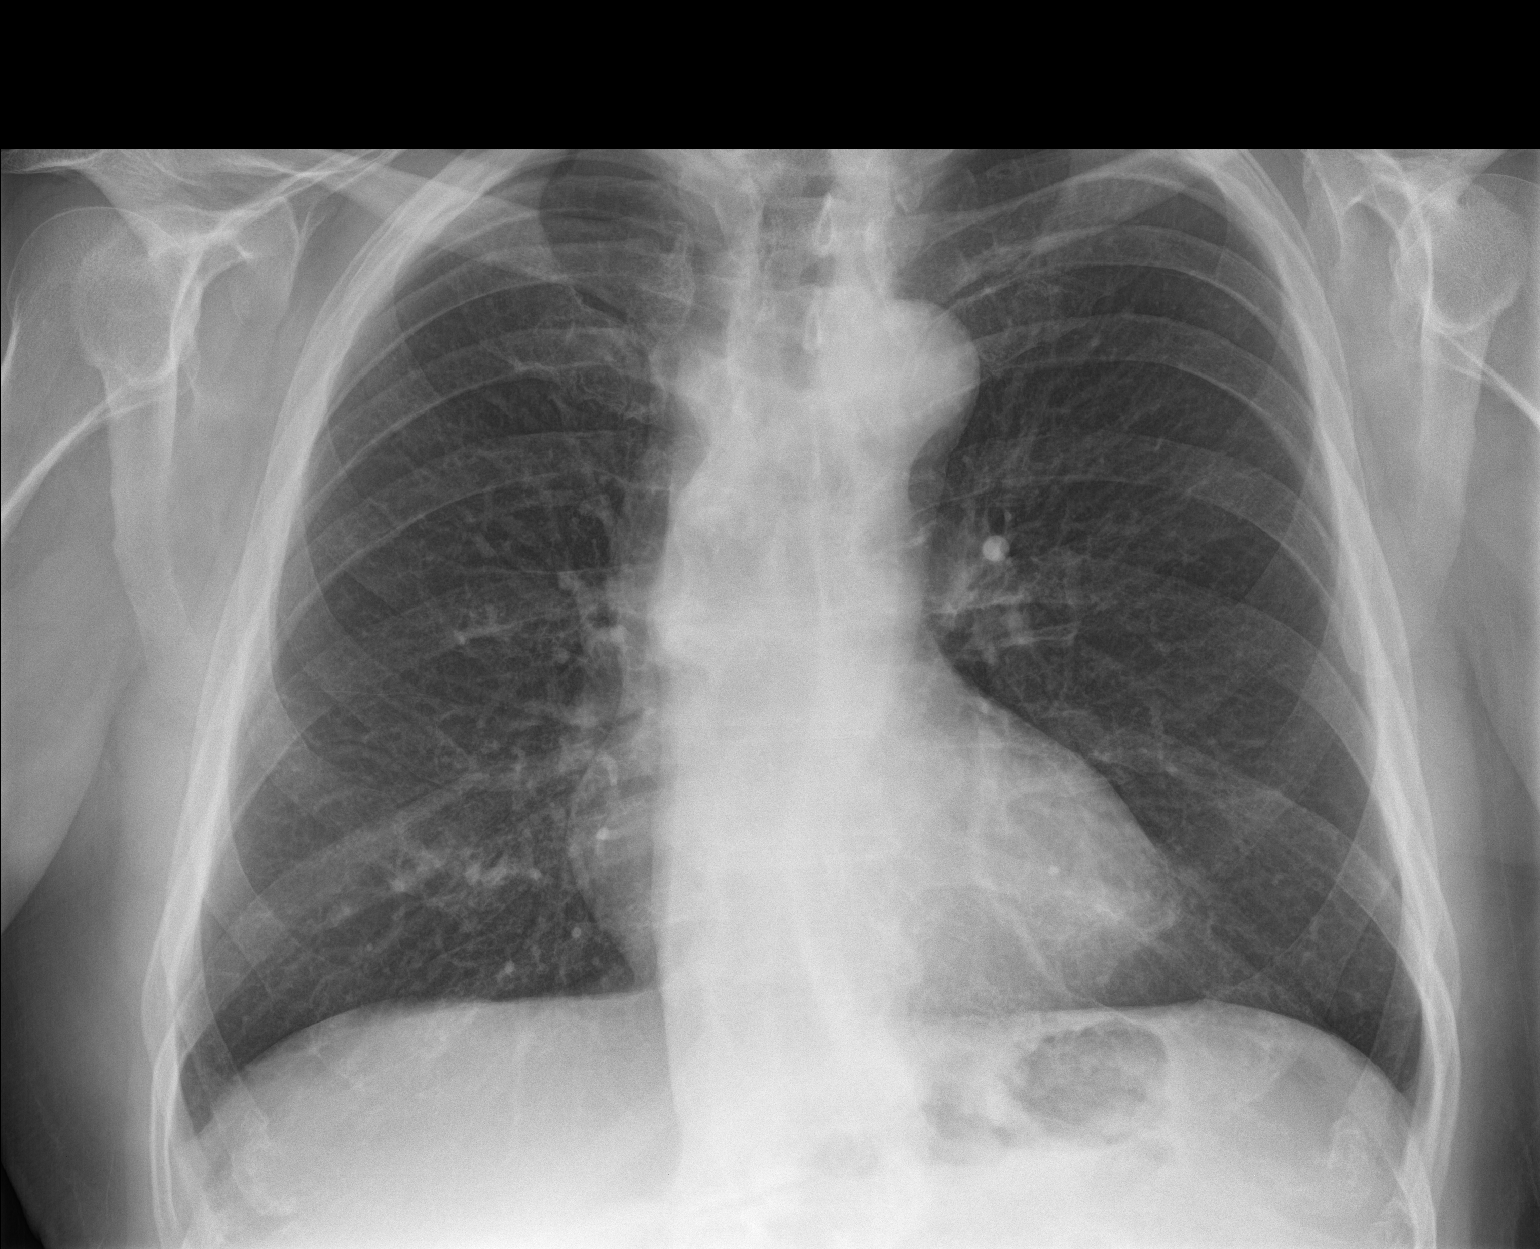

[Series 2: chest lat · 0.14mm/px · 2 of 2 slices shown]
[im 1/2]
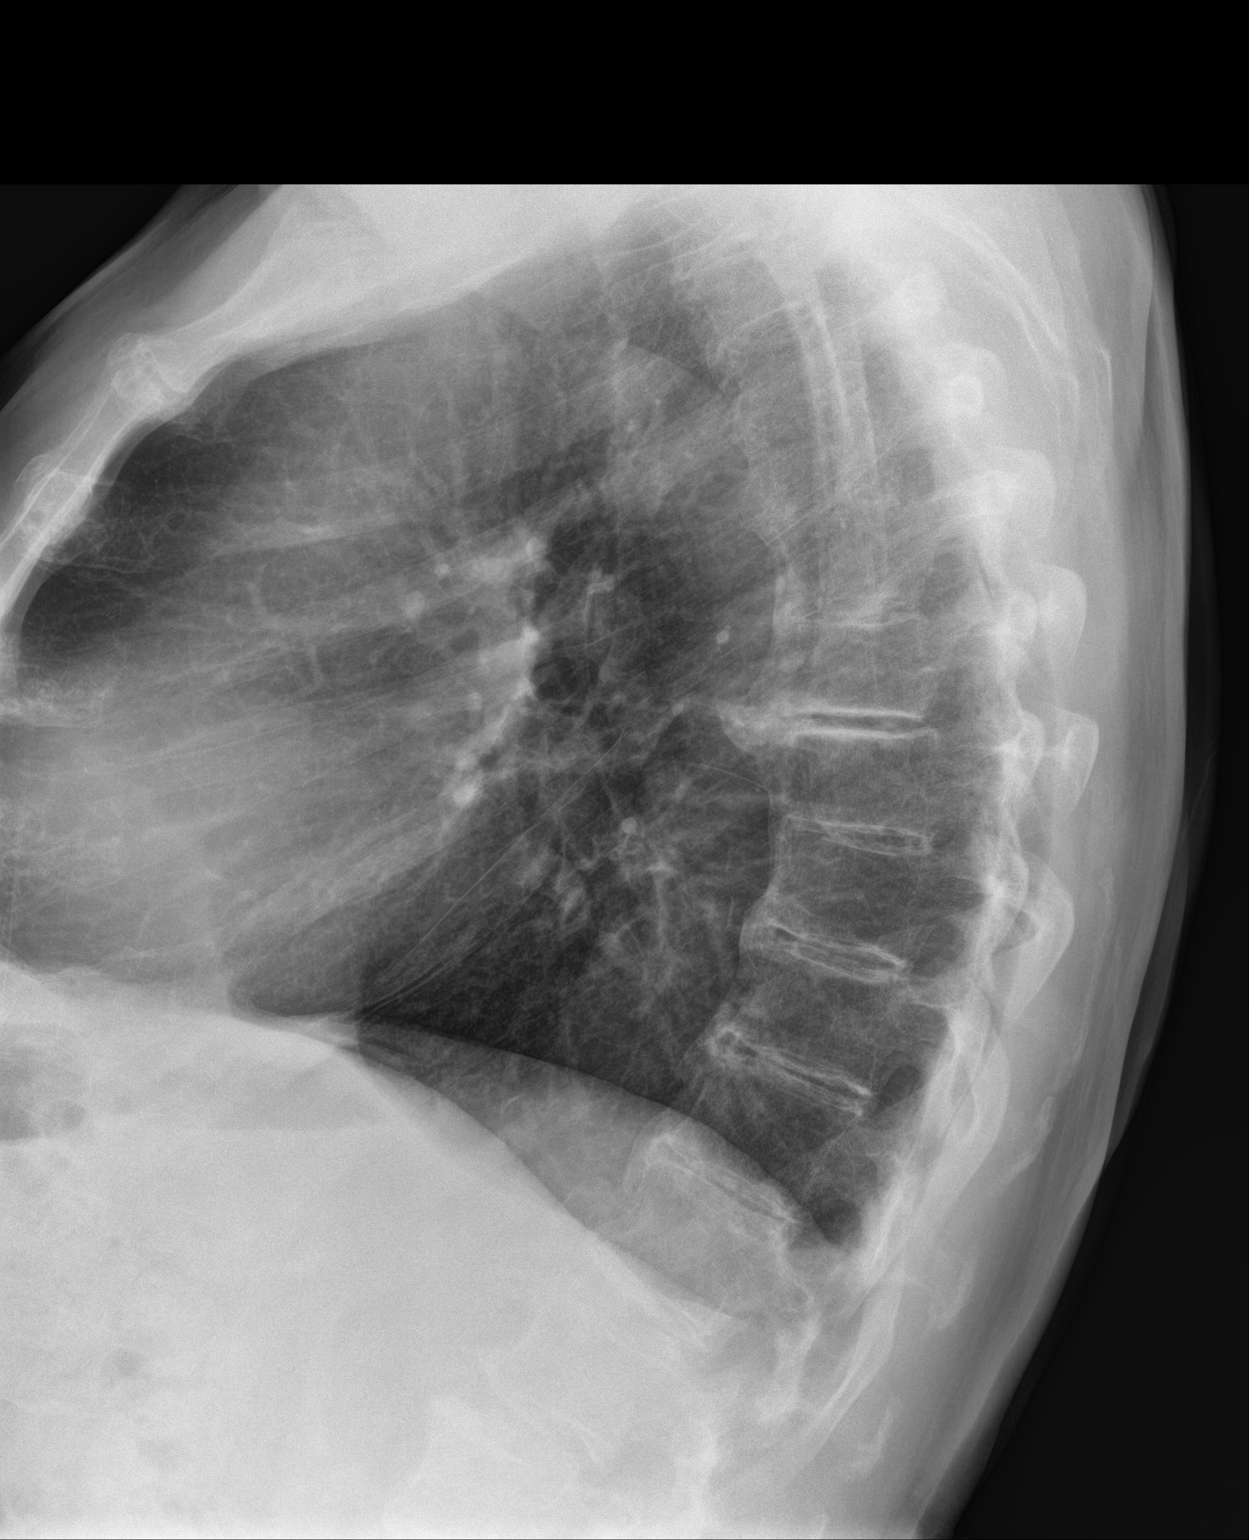
[im 2/2]
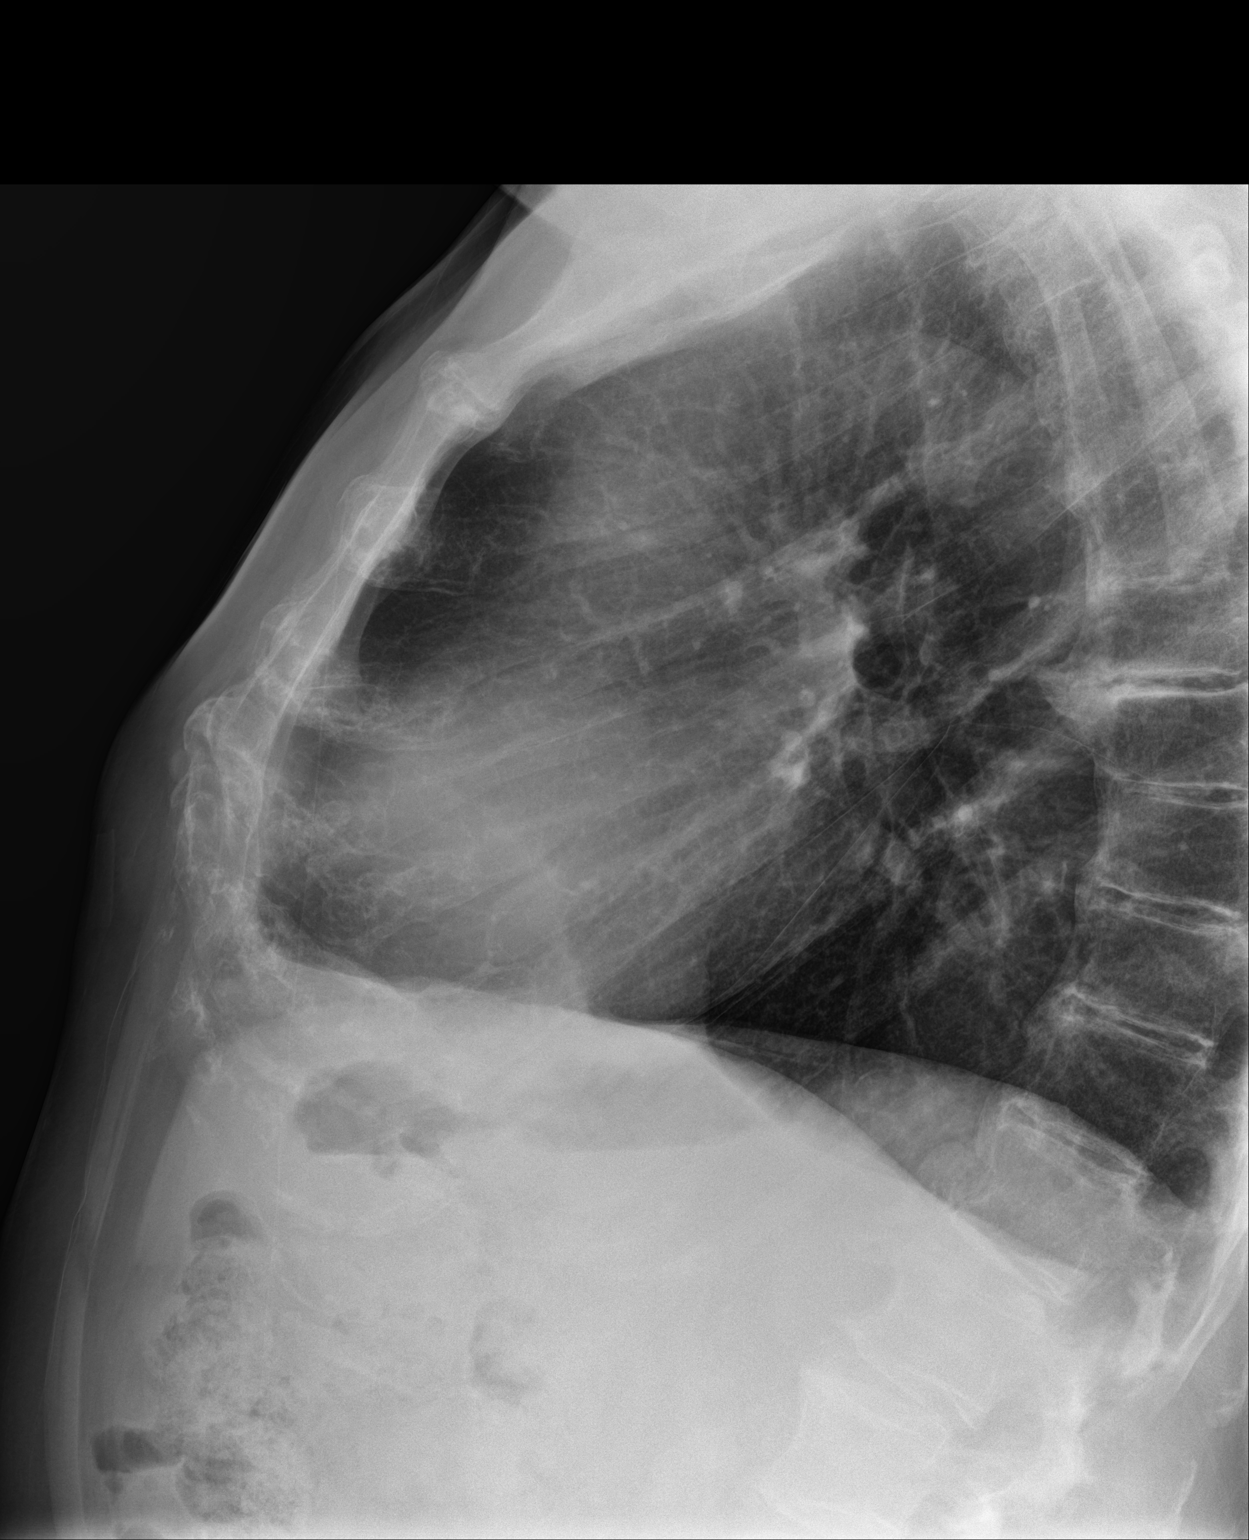

[3 of 3 positions shown; findings below may reference images not displayed]

FINDINGS: The heart size and mediastinal contours are within normal limits.
Both lungs are clear. No acute osseous abnormality. Degenerative
changes noted within the thoracic spine, mild to moderate in degree.
IMPRESSION: No evidence of acute cardiopulmonary abnormality. No acute osseous
abnormality or evidence of osseous metastasis seen.

## 2018-07-30 ENCOUNTER — Ambulatory Visit
Admission: EM | Admit: 2018-07-30 | Discharge: 2018-07-30 | Disposition: A | Discharge status: Home / Self Care | Payer: Medicare Other

## 2018-07-30 ENCOUNTER — Other Ambulatory Visit: Payer: Self-pay

## 2018-07-30 ENCOUNTER — Encounter: Payer: Self-pay | Admitting: Gynecology

## 2018-07-30 DIAGNOSIS — M069 Rheumatoid arthritis, unspecified: Secondary | ICD-10-CM | POA: Diagnosis not present

## 2018-07-30 DIAGNOSIS — M79641 Pain in right hand: Secondary | ICD-10-CM

## 2018-07-30 DIAGNOSIS — M25441 Effusion, right hand: Secondary | ICD-10-CM

## 2018-07-30 MED ORDER — PREDNISONE 10 MG PO TABS: ORAL_TABLET | ORAL | 0 refills | Status: DC

## 2018-07-30 MED ORDER — DICLOFENAC SODIUM 1 % TD GEL: 2.0000 g | Freq: Four times a day (QID) | TRANSDERMAL | 1 refills | Status: AC

## 2018-07-30 NOTE — Discharge Instructions (Addendum)
RIGHT MIDDLE FINGER PAIN/SWELLING: This is likely related to your rheumatoid arthritis causing a flare up. You may try topical diclofenac first in addition to Tylenol for pain relief. Also consider cryotherapy. If this is not improving in 2-3 days, begin prednisone. Keep an eye on BP as prednisone can elevate BP. If it elevates >160/100, consider discontinuing use and f/u with PCP. You may follow up with our department if needed for any worsening of condition or no improvement with prescribed therapy.   DUE TO MEDICAL HISTORY OR RA, THERE IS MINIMAL CONCERN FOR ANY TYPE OF JOINT INFECTION, HOWEVER IF PAIN/SWELLING/REDNESS WORSENS, YOU SEE RED STREAKS UP FINGER/HAND, OR HAVE FEVER, YOU SHOULD IMMEDIATELY SEEK RE-EVALUATION.

## 2018-07-30 NOTE — ED Provider Notes (Signed)
[facility]    CSN: [id number] Arrival date & time: 07/30/18  0901     History   Chief Complaint Chief Complaint  Patient presents with  . Arthritis    HPI Jeremiah Lewis is a 85 y.o. male. Patient presents with wife today for pain and swelling of the right middle finger around the PIP joint x 2 weeks. He denies injury, deformity, laceration/abrasion. He admits to a history of gout and rheumatoid arthritis. So far, he has taken Tylenol for pain. He was advised by a family member to try topical diclofenac sodium for arthritis so that is what he would like to be prescribed today. He has some limitations with flexing at the PIP joint. There is mild erythema about the PIP joint. He denies numbness/tingling/weakness and says pain is moderate. He has no other concerns/complaints today.  HPI  Past Medical History:  Diagnosis Date  . Elevated PSA   . Gout   . Hypertension   . Hypertension   . Prostate cancer [facility])    prostate  . Prostatitis   . Rheumatoid arteritis [facility])     Patient Active Problem List   Diagnosis Date Noted  . Malignant neoplasm of prostate ([place]) 05/22/2015  . BP (high blood pressure) 01/23/2015  . Cardiac murmur 01/23/2015    Past Surgical History:  Procedure Laterality Date  . HEMORROIDECTOMY    . PROSTATE BIOPSY    . PROSTATE CRYOABLATION  2006       Home Medications    Prior to Admission medications   Medication Sig Start Date End Date Taking? Authorizing Provider  amLODipine (NORVASC) 5 MG tablet Take by mouth. 01/28/18 01/28/19 Yes [provider]  aspirin 81 MG tablet Take 81 mg by mouth daily.   Yes [provider]  latanoprost (XALATAN) 0.005 % ophthalmic solution 1 drop at bedtime.   Yes [provider]  lisinopril (PRINIVIL,ZESTRIL) 40 MG tablet Take 40 mg by mouth daily.   Yes [provider]  diclofenac sodium (VOLTAREN) 1 % GEL Apply 2 g topically 4 (four) times daily for 7 days. 07/30/18  08/06/18  Danton Clap, PA-C  hydrochlorothiazide (MICROZIDE) 12.5 MG capsule  04/29/15   [provider]  indomethacin (INDOCIN) 25 MG capsule  07/12/18   [provider]  predniSONE (DELTASONE) 10 MG tablet Take 1 tablet PO daily x 5 days, then 1/2 tablet daily x 2 days 07/30/18   [name]    Family History Family History  Problem Relation Age of Onset  . Hypertension Other   . Prostate cancer Neg Hx   . Bladder Cancer Neg Hx     Social History Social History   Tobacco Use  . Smoking status: Former Smoker  Substance Use Topics  . Alcohol use: Yes    Comment: beer on most evenings  . Drug use: No     Allergies   Patient has no known allergies.   Review of Systems Review of Systems  Constitutional: Negative for fatigue and fever.  Musculoskeletal: Positive for arthralgias and joint swelling. Negative for myalgias.  Skin: Positive for color change (redness of affected finger mildly). Negative for rash and wound.  Neurological: Negative for weakness and numbness.  Hematological: Negative for adenopathy. Does not bruise/bleed easily.     Physical Exam Triage Vital Signs ED Triage Vitals  Enc Vitals Group     BP      Pulse      Resp  Temp      Temp src      SpO2      Weight      Height      Head Circumference      Peak Flow      Pain Score      Pain Loc      Pain Edu?      Excl. in [employer]?    No data found.  Updated Vital Signs BP (!) 148/60 (BP Location: Left Arm)   Pulse 77   Temp 98.2 F (36.8 C) (Oral)   Resp 16   Ht 6\' 2"  (1.88 m)   Wt 178 lb (80.7 kg)   SpO2 100%   BMI 22.85 kg/m        Physical Exam Vitals signs and nursing note reviewed.  Constitutional:      General: He is not in acute distress.    Appearance: Normal appearance. He is normal weight. He is not ill-appearing.  HENT:     Head: Normocephalic and atraumatic.  Eyes:     General: No scleral icterus.    Conjunctiva/sclera: Conjunctivae  normal.  Cardiovascular:     Rate and Rhythm: Normal rate and regular rhythm.  Pulmonary:     Effort: Pulmonary effort is normal.     Breath sounds: Normal breath sounds. No wheezing or rhonchi.  Musculoskeletal:     Comments: RIGHT HAND: No abrasion, lacerations, deformity. There is mild erythema overlying PIP joint. TTP of PIP joint and distal proximal phalanx. Moderately limited ROM with flexion at the PIP joint only. Normal sensation and strength  Skin:    General: Skin is warm and dry.     Coloration: Pallor: mild erythema overlying PIP joint and slightly above and below joint.     Findings: Erythema present.  Neurological:     General: No focal deficit present.     Mental Status: He is alert and oriented to person, place, and time.  Psychiatric:        Mood and Affect: Mood normal.        Behavior: Behavior normal.        Thought Content: Thought content normal.      UC Treatments / Results  Labs (all labs ordered are listed, but only abnormal results are displayed) Labs Reviewed - No data to display  EKG None  Radiology No results found.  Procedures Procedures (including critical care time)  Medications Ordered in UC Medications - No data to display  Initial Impression / Assessment and Plan / UC Course  I have reviewed the triage vital signs and the nursing notes.  Pertinent labs & imaging results that were available during my care of the patient were reviewed by me and considered in my medical decision making (see chart for details).     Final Clinical Impressions(s) / UC Diagnoses   Final diagnoses:  Rheumatoid arthritis flare (HCC)  Right hand pain  Effusion of hand joint, right     Discharge Instructions     RIGHT MIDDLE FINGER PAIN/SWELLING: This is likely related to your rheumatoid arthritis causing a flare up. You may try topical diclofenac first in addition to Tylenol for pain relief. Also consider cryotherapy. If this is not improving in 2-3  days, begin prednisone. Keep an eye on BP as prednisone can elevate BP. If it elevates >160/100, consider discontinuing use and f/u with PCP. You may follow up with our department if needed for any worsening of condition or no improvement  with prescribed therapy.   DUE TO MEDICAL HISTORY OR RA, THERE IS MINIMAL CONCERN FOR ANY TYPE OF JOINT INFECTION, HOWEVER IF PAIN/SWELLING/REDNESS WORSENS, YOU SEE RED STREAKS UP FINGER/HAND, OR HAVE FEVER, YOU SHOULD IMMEDIATELY SEEK RE-EVALUATION.      ED Prescriptions    Medication Sig Dispense Auth. Provider   diclofenac sodium (VOLTAREN) 1 % GEL Apply 2 g topically 4 (four) times daily for 7 days. 1 Tube Laurene Footman B, PA-C   predniSONE (DELTASONE) 10 MG tablet  (Status: Discontinued) Take 1 tablet PO daily x 5 days, then 1/2 tablet daily x 2 days 6 tablet Laurene Footman B, PA-C   predniSONE (DELTASONE) 10 MG tablet Take 1 tablet PO daily x 5 days, then 1/2 tablet daily x 2 days 6 tablet Danton Clap, PA-C     Controlled Substance Prescriptions [place] Controlled Substance Registry consulted? Not Applicable   [name] 07/30/18 1008

## 2018-07-30 NOTE — ED Triage Notes (Signed)
Patient c/o arthritis in right hand.

## 2020-07-28 ENCOUNTER — Other Ambulatory Visit: Payer: Self-pay

## 2020-07-28 ENCOUNTER — Observation Stay: Payer: Medicare Other

## 2020-07-28 ENCOUNTER — Observation Stay
Admission: EM | Admit: 2020-07-28 | Discharge: 2020-07-29 | Disposition: A | Discharge status: Home / Self Care | Payer: Medicare Other | Attending: Internal Medicine | Admitting: Internal Medicine

## 2020-07-28 DIAGNOSIS — Z20822 Contact with and (suspected) exposure to covid-19: Secondary | ICD-10-CM | POA: Diagnosis not present

## 2020-07-28 DIAGNOSIS — I200 Unstable angina: Secondary | ICD-10-CM | POA: Diagnosis not present

## 2020-07-28 DIAGNOSIS — I4891 Unspecified atrial fibrillation: Secondary | ICD-10-CM | POA: Insufficient documentation

## 2020-07-28 DIAGNOSIS — I509 Heart failure, unspecified: Secondary | ICD-10-CM

## 2020-07-28 DIAGNOSIS — Z79899 Other long term (current) drug therapy: Secondary | ICD-10-CM | POA: Diagnosis not present

## 2020-07-28 DIAGNOSIS — I10 Essential (primary) hypertension: Secondary | ICD-10-CM | POA: Insufficient documentation

## 2020-07-28 DIAGNOSIS — R079 Chest pain, unspecified: Secondary | ICD-10-CM | POA: Diagnosis not present

## 2020-07-28 DIAGNOSIS — Z87891 Personal history of nicotine dependence: Secondary | ICD-10-CM | POA: Diagnosis not present

## 2020-07-28 DIAGNOSIS — I493 Ventricular premature depolarization: Secondary | ICD-10-CM | POA: Diagnosis not present

## 2020-07-28 DIAGNOSIS — Z7982 Long term (current) use of aspirin: Secondary | ICD-10-CM | POA: Insufficient documentation

## 2020-07-28 DIAGNOSIS — Z8546 Personal history of malignant neoplasm of prostate: Secondary | ICD-10-CM | POA: Diagnosis not present

## 2020-07-28 LAB — COMPREHENSIVE METABOLIC PANEL
ALT: 14 U/L (ref 0–44)
AST: 18 U/L (ref 15–41)
Albumin: 4.1 g/dL (ref 3.5–5.0)
Alkaline Phosphatase: 61 U/L (ref 38–126)
Anion gap: 9 (ref 5–15)
BUN: 24 mg/dL — ABNORMAL HIGH (ref 8–23)
CO2: 23 mmol/L (ref 22–32)
Calcium: 9.4 mg/dL (ref 8.9–10.3)
Chloride: 106 mmol/L (ref 98–111)
Creatinine, Ser: 1.06 mg/dL (ref 0.61–1.24)
GFR, Estimated: >60 mL/min (ref >60)
Glucose, Bld: 123 mg/dL — ABNORMAL HIGH (ref 70–99)
Potassium: 4.2 mmol/L (ref 3.5–5.1)
Sodium: 138 mmol/L (ref 135–145)
Total Bilirubin: 1.1 mg/dL (ref 0.3–1.2)
Total Protein: 6.9 g/dL (ref 6.5–8.1)

## 2020-07-28 LAB — TSH: TSH: 2.167 u[IU]/mL (ref 0.350–4.500)

## 2020-07-28 LAB — CBC WITH DIFFERENTIAL/PLATELET
Abs Immature Granulocytes: 0.02 10*3/uL (ref 0.00–0.07)
Basophils Absolute: 0 10*3/uL (ref 0.0–0.1)
Basophils Relative: 0 %
Eosinophils Absolute: 0.1 10*3/uL (ref 0.0–0.5)
Eosinophils Relative: 1 %
HCT: 40.9 % (ref 39.0–52.0)
Hemoglobin: 14 g/dL (ref 13.0–17.0)
Immature Granulocytes: 0 %
Lymphocytes Relative: 8 %
Lymphs Abs: 0.7 10*3/uL (ref 0.7–4.0)
MCH: 35.2 pg — ABNORMAL HIGH (ref 26.0–34.0)
MCHC: 34.2 g/dL (ref 30.0–36.0)
MCV: 102.8 fL — ABNORMAL HIGH (ref 80.0–100.0)
Monocytes Absolute: 1 10*3/uL (ref 0.1–1.0)
Monocytes Relative: 12 %
Neutro Abs: 6.6 10*3/uL (ref 1.7–7.7)
Neutrophils Relative %: 79 %
Platelets: 131 10*3/uL — ABNORMAL LOW (ref 150–400)
RBC: 3.98 MIL/uL — ABNORMAL LOW (ref 4.22–5.81)
RDW: 13.6 % (ref 11.5–15.5)
WBC: 8.4 10*3/uL (ref 4.0–10.5)
nRBC: 0 % (ref 0.0–0.2)

## 2020-07-28 LAB — PROTIME-INR
INR: 1.1 (ref 0.8–1.2)
Prothrombin Time: 14.1 seconds (ref 11.4–15.2)

## 2020-07-28 LAB — TROPONIN I (HIGH SENSITIVITY)
Troponin I (High Sensitivity): 12 ng/L (ref <18)
Troponin I (High Sensitivity): 11 ng/L (ref <18)

## 2020-07-28 LAB — HEMOGLOBIN A1C
Hgb A1c MFr Bld: 4.9 % (ref 4.8–5.6)
Mean Plasma Glucose: 93.93 mg/dL

## 2020-07-28 LAB — MAGNESIUM: Magnesium: 2.2 mg/dL (ref 1.7–2.4)

## 2020-07-28 LAB — SARS CORONAVIRUS 2 (TAT 6-24 HRS): SARS Coronavirus 2: NEGATIVE

## 2020-07-28 LAB — LIPASE, BLOOD: Lipase: 30 U/L (ref 11–51)

## 2020-07-28 LAB — BRAIN NATRIURETIC PEPTIDE: B Natriuretic Peptide: 646 pg/mL — ABNORMAL HIGH (ref 0.0–100.0)

## 2020-07-28 LAB — APTT: aPTT: 31 seconds (ref 24–36)

## 2020-07-28 LAB — HEPARIN LEVEL (UNFRACTIONATED): Heparin Unfractionated: 0.1 IU/mL — ABNORMAL LOW (ref 0.30–0.70)

## 2020-07-28 MED ORDER — SODIUM CHLORIDE 0.9% FLUSH
3.0000 mL | Freq: Two times a day (BID) | INTRAVENOUS | Status: DC
  Administered 2020-07-28: 3 mL via INTRAVENOUS

## 2020-07-28 MED ORDER — HEPARIN (PORCINE) 25000 UT/250ML-% IV SOLN
1500.0000 [IU]/h | INTRAVENOUS | Status: DC
  Administered 2020-07-28 – 2020-07-29 (×2): 1200 [IU]/h via INTRAVENOUS
  Filled 2020-07-28: qty 250

## 2020-07-28 MED ORDER — ASPIRIN EC 81 MG PO TBEC
81.0000 mg | DELAYED_RELEASE_TABLET | Freq: Every day | ORAL | Status: DC
  Filled 2020-07-28: qty 1

## 2020-07-28 MED ORDER — ACETAMINOPHEN 325 MG PO TABS
650.0000 mg | ORAL_TABLET | ORAL | Status: DC | PRN
  Administered 2020-07-28: 650 mg via ORAL
  Filled 2020-07-28: qty 2

## 2020-07-28 MED ORDER — HEPARIN BOLUS VIA INFUSION
4000.0000 [IU] | Freq: Once | INTRAVENOUS | Status: AC
  Administered 2020-07-28: 4000 [IU] via INTRAVENOUS
  Filled 2020-07-28: qty 4000

## 2020-07-28 MED ORDER — SODIUM CHLORIDE 0.9 % IV SOLN: INTRAVENOUS | Status: DC

## 2020-07-28 MED ORDER — ONDANSETRON HCL 4 MG/2ML IJ SOLN
4.0000 mg | Freq: Four times a day (QID) | INTRAMUSCULAR | Status: DC | PRN
  Administered 2020-07-28: 4 mg via INTRAVENOUS
  Filled 2020-07-28: qty 2

## 2020-07-28 MED ORDER — HEPARIN (PORCINE) 25000 UT/250ML-% IV SOLN
950.0000 [IU]/h | INTRAVENOUS | Status: DC
  Administered 2020-07-28: 950 [IU]/h via INTRAVENOUS
  Filled 2020-07-28: qty 250

## 2020-07-28 MED ORDER — MORPHINE SULFATE (PF) 2 MG/ML IV SOLN
2.0000 mg | INTRAVENOUS | Status: DC | PRN
  Administered 2020-07-28: 2 mg via INTRAVENOUS
  Filled 2020-07-28: qty 1

## 2020-07-28 MED ORDER — ATORVASTATIN CALCIUM 10 MG PO TABS
10.0000 mg | ORAL_TABLET | Freq: Every day | ORAL | Status: DC
  Administered 2020-07-28 – 2020-07-29 (×2): 10 mg via ORAL
  Filled 2020-07-28 (×2): qty 1

## 2020-07-28 MED ORDER — METOPROLOL TARTRATE 25 MG PO TABS
12.5000 mg | ORAL_TABLET | Freq: Two times a day (BID) | ORAL | Status: DC
  Administered 2020-07-28 (×2): 12.5 mg via ORAL
  Filled 2020-07-28 (×3): qty 1

## 2020-07-28 MED ORDER — NITROGLYCERIN 0.4 MG SL SUBL
0.4000 mg | SUBLINGUAL_TABLET | SUBLINGUAL | Status: DC | PRN
  Administered 2020-07-28: 0.4 mg via SUBLINGUAL
  Filled 2020-07-28: qty 1

## 2020-07-28 MED ORDER — HEPARIN BOLUS VIA INFUSION
2400.0000 [IU] | Freq: Once | INTRAVENOUS | Status: AC
  Administered 2020-07-28: 2400 [IU] via INTRAVENOUS
  Filled 2020-07-28: qty 2400

## 2020-07-28 NOTE — Progress Notes (Signed)
Same day rounding progress note  Patient seen and examined while in the ED, waiting for the floor bed.  Vitals, labs, records reviewed.  Discussed with patient, nursing and cardiology  At present patient reports minimal chest pain.  Covid negative.  New onset A. fib  Currently on heparin drip pending echo Transition him from heparin to NOAC at discharge tomorrow  Time spent: 15 minutes

## 2020-07-28 NOTE — Progress Notes (Signed)
ANTICOAGULATION CONSULT NOTE - Initial Consult  Pharmacy Consult for Heparin  Indication: chest pain/ACS  No Known Allergies  Patient Measurements: Height: 6\' 2"  (188 cm) Weight: 80.7 kg (178 lb) IBW/kg (Calculated) : 82.2 Heparin Dosing Weight: 80.7 kg   Vital Signs: Temp: 99.3 F (37.4 C) (02/20 0409) Temp Source: Oral (02/20 0409) BP: 162/56 (02/20 0409) Pulse Rate: 75 (02/20 0409)  Labs: Recent Labs    07/28/20 0405  HGB 14.0  HCT 40.9  PLT 131*  APTT 31  LABPROT 14.1  INR 1.1  CREATININE 1.06  TROPONINIHS 12    Estimated Creatinine Clearance: 56 mL/min (by C-G formula based on SCr of 1.06 mg/dL).   Medical History: Past Medical History:  Diagnosis Date  . Elevated PSA   . Gout   . Hypertension   . Hypertension   . Prostate cancer [facility])    prostate  . Prostatitis   . Rheumatoid arteritis (HCC)     Medications:  (Not in a hospital admission)   Assessment: Pharmacy consulted to dose heparin in this 87 year old male admitted with unstable angina.  CrCl = 56 ml/min No prior anticoag noted.   Goal of Therapy:  Heparin level 0.3-0.7 units/ml Monitor platelets by anticoagulation protocol: Yes   Plan:  Give 4000 units bolus x 1 Start heparin infusion at 950 units/hr Check anti-Xa level in 8 hours and daily while on heparin Continue to monitor H&H and platelets  Chace Klippel D 07/28/2020,5:39 AM

## 2020-07-28 NOTE — H&P (Addendum)
History and Physical    Jeremiah Lewis [id number] DOB: [birth year] DOA: 07/28/2020  PCP: Juanell Fairly, MD   Patient coming from: home  I have personally briefly reviewed patient's old medical records in [place]  Chief Complaint: Chest pain  HPI: Jeremiah Lewis is a 87 y.o. male with medical history significant for hypertension, dementia and prostate cancer, who presents to the emergency room by EMS with reports of left-sided chest pain radiating to back and shoulders, without aggravating or alleviating symptoms.  History is limited due to history of dementia.  Patient states his pain is mostly in his mid upper back and is worse with movement.  He denies associated nausea, vomiting, diaphoresis, lightheadedness, palpitations.  Denies cough, fever or chills.  On speaking to the wife over the phone, she stated that patient was rubbing on his chest and shoulder when EMS was called.  He was administered nitroglycerin sublingual and aspirin in route with complete resolution of chest pain. ED Course: On arrival, temp 99.3 with BP 162/56, pulse 75 respirations 23 with O2 sat 98% on room air.  CBC and BMP unremarkable.  Lipase 30, troponin 12 EKG as reviewed by me : Atrial fibrillation at 76.  No acute ST-T wave changes Imaging: Chest x-ray resulting after admission:Mild diffuse increase interstitial and airspace opacities which may represent multifocal infection versus pulmonary edema  Heparin bolus started in the emergency room for suspected unstable angina  Review of Systems: Limited due to history of dementia  Past Medical History:  Diagnosis Date  . Elevated PSA   . Gout   . Hypertension   . Hypertension   . Prostate cancer [facility])    prostate  . Prostatitis   . Rheumatoid arteritis ([place])     Past Surgical History:  Procedure Laterality Date  . HEMORROIDECTOMY    . PROSTATE BIOPSY    . PROSTATE CRYOABLATION  2006     reports that he has quit smoking. He has never  used smokeless tobacco. He reports current alcohol use. He reports that he does not use drugs.  No Known Allergies  Family History  Problem Relation Age of Onset  . Hypertension Other   . Prostate cancer Neg Hx   . Bladder Cancer Neg Hx       Prior to Admission medications   Medication Sig Start Date End Date Taking? Authorizing Provider  amLODipine (NORVASC) 5 MG tablet Take by mouth. 01/28/18 01/28/19  [provider]  aspirin 81 MG tablet Take 81 mg by mouth daily.    [provider]  hydrochlorothiazide (MICROZIDE) 12.5 MG capsule  04/29/15   [provider]  indomethacin (INDOCIN) 25 MG capsule  07/12/18   [provider]  latanoprost (XALATAN) 0.005 % ophthalmic solution 1 drop at bedtime.    [provider]  lisinopril (PRINIVIL,ZESTRIL) 40 MG tablet Take 40 mg by mouth daily.    [provider]  predniSONE (DELTASONE) 10 MG tablet Take 1 tablet PO daily x 5 days, then 1/2 tablet daily x 2 days 07/30/18   Danton Clap, [place]    Physical Exam: Vitals:   07/28/20 0403 07/28/20 0409  BP:  (!) 162/56  Pulse:  75  Resp:  (!) 23  Temp:  99.3 F (37.4 C)  TempSrc:  Oral  SpO2:  98%  Weight: 80.7 kg   Height: 6\' 2"  (1.88 m)      Vitals:   07/28/20 0403 07/28/20 0409  BP:  (!) 162/56  Pulse:  75  Resp:  (!) 23  Temp:  99.3 F (37.4 C)  TempSrc:  Oral  SpO2:  98%  Weight: 80.7 kg   Height: 6\' 2"  (1.88 m)       Constitutional: Alert and oriented x2. Not in any apparent distress HEENT:      Head: Normocephalic and atraumatic.         Eyes: PERLA, EOMI, Conjunctivae are normal. Sclera is non-icteric.       Mouth/Throat: Mucous membranes are moist.       Neck: Supple with no signs of meningismus. Cardiovascular: Regular rate and rhythm. No murmurs, gallops, or rubs. 2+ symmetrical distal pulses are present . No JVD. 2+ LE edema Respiratory: Respiratory effort normal .Lungs sounds clear bilaterally. No wheezes,  crackles, or rhonchi. Pain on palpation upper mid back Gastrointestinal: Soft, non tender, and non distended with positive bowel sounds.  Genitourinary: No CVA tenderness. Musculoskeletal: Nontender with normal range of motion in all extremities. No cyanosis, or erythema of extremities. Neurologic:  Face is symmetric. Moving all extremities. No gross focal neurologic deficits . Skin: Skin is warm, dry.  No rash or ulcers Psychiatric: Mood and affect are normal    Labs on Admission: I have personally reviewed following labs and imaging studies  CBC: Recent Labs  Lab 07/28/20 0405  WBC 8.4  NEUTROABS 6.6  HGB 14.0  HCT 40.9  MCV 102.8*  PLT 782*   Basic Metabolic Panel: Recent Labs  Lab 07/28/20 0405  NA 138  K 4.2  CL 106  CO2 23  GLUCOSE 123*  BUN 24*  CREATININE 1.06  CALCIUM 9.4  MG 2.2   GFR: Estimated Creatinine Clearance: 56 mL/min (by C-G formula based on SCr of 1.06 mg/dL). Liver Function Tests: Recent Labs  Lab 07/28/20 0405  AST 18  ALT 14  ALKPHOS 61  BILITOT 1.1  PROT 6.9  ALBUMIN 4.1   Recent Labs  Lab 07/28/20 0405  LIPASE 30   No results for input(s): AMMONIA in the last 168 hours. Coagulation Profile: Recent Labs  Lab 07/28/20 0405  INR 1.1   Cardiac Enzymes: No results for input(s): CKTOTAL, CKMB, CKMBINDEX, TROPONINI in the last 168 hours. BNP (last 3 results) No results for input(s): PROBNP in the last 8760 hours. HbA1C: No results for input(s): HGBA1C in the last 72 hours. CBG: No results for input(s): GLUCAP in the last 168 hours. Lipid Profile: No results for input(s): CHOL, HDL, LDLCALC, TRIG, CHOLHDL, LDLDIRECT in the last 72 hours. Thyroid Function Tests: No results for input(s): TSH, T4TOTAL, FREET4, T3FREE, THYROIDAB in the last 72 hours. Anemia Panel: No results for input(s): VITAMINB12, FOLATE, FERRITIN, TIBC, IRON, RETICCTPCT in the last 72 hours. Urine analysis:    Component Value Date/Time   APPEARANCEUR  Cloudy (A) 05/22/2015 0926   GLUCOSEU Negative 05/22/2015 0926   BILIRUBINUR Negative 05/22/2015 0926   PROTEINUR Negative 05/22/2015 0926   NITRITE Negative 05/22/2015 0926   LEUKOCYTESUR 3+ (A) 05/22/2015 0926    Radiological Exams on Admission: DG Chest Portable 1 View  Result Date: 07/28/2020 CLINICAL DATA:  Chest pain EXAM: PORTABLE CHEST 1 VIEW COMPARISON:  04/07/2015 FINDINGS: Mild cardiac enlargement. Decreased lung volumes. Mild diffuse increase interstitial and airspace opacities identified bilaterally. No pleural effusions. The visualized osseous structures are unremarkable. IMPRESSION: Mild diffuse increase interstitial and airspace opacities which may represent multifocal infection versus pulmonary edema. Electronically Signed   By: Kerby Moors M.D.   On: 07/28/2020 06:00  Assessment/Plan 87 year old male with history of hypertension, and prostate cancer, presenting with atypical chest pain/tightness resolved with nitroglycerin sublingual and aspirin in route     Chest pain  -Patient with mostly atypical chest pain, without EKG changes and with normal first troponin of 12 -Pain reportedly resolved with nitroglycerin and aspirin in route -Patient was started on heparin infusion in the ER .  Discontinue if ruled out -Follow-up second troponin -Aspirin statin and beta-blocker -Cardiology consult  Abnormal chest x-ray/Covid PUI -Chest x-ray resulting after admission showing possible multifocal pneumonia -Patient with low-grade temperature of 99.3 but without shortness of breath -Covid precautions until resulted     Unspecified atrial fibrillation (HCC) -EKG showing A. fib/flutter rate of 76 with several PVCs, with no prior history of A. fib -Rate control -CHA2DS2-VASc score of 2 so if A. fib confirmed will benefit from systemic anticoagulation for stroke prevention -Cardiology consult for recommendations  Possible CHF -Bilateral lower extremity edema with  possible pulmonary edema on chest x-ray -Follow-up BNP -Follow-up echocardiogram     HTN (hypertension) -BP controlled.  Continue home meds.    DVT prophylaxis: Lovenox  Code Status: full code  Family Communication:  none  Disposition Plan: Back to previous home environment Consults called: cardiology  Status:observation     Athena Masse MD [facility]     07/28/2020, 5:31 AM

## 2020-07-28 NOTE — ED Notes (Signed)
Patient adjusted in bed. C/o pain between shoulders and generally feeling uncomfortable.

## 2020-07-28 NOTE — ED Provider Notes (Signed)
[facility] Emergency Department Provider Note  ____________________________________________   Event Date/Time   First MD Initiated Contact with Patient 07/28/20 0402     (approximate)  I have reviewed the triage vital signs and the nursing notes.   HISTORY  Chief Complaint Chest Pain    HPI Jeremiah Lewis is a 87 y.o. male with medical history as listed below who presents by EMS for evaluation of chest pain.  The patient initially said the chest pain he just had a couple of hours ago but then he admitted he was having episodes of chest pain yesterday but he did not want to tell his daughter because that she would worry.  He does not usually have chest pain and has no specific cardiac history.  He is treated for hypertension but is not on blood thinners and has not been told he is ever had a heart attack or had problems with atrial fibrillation.  He said that the pain is a dull aching pain that is primarily in the upper part of his chest and feels a little bit heavy.  Occasionally it radiates through to his back.  No radiation to the jaw or the arm.  No associated shortness of breath.  He denies nausea and vomiting.  No recent fever or chills, sore throat, nasal congestion, runny nose, nor cough.  The pain improved substantially after nitroglycerin by EMS.  He also received a full dose aspirin by EMS prior to arrival.  He now currently reports that the pain is mild but it was severe earlier.  He does not remember ever being told that he has problems with his heart or abnormal rhythms.  He has never had a heart attack.  He goes to [facility] for most of his care.   Nothing in particular made the symptoms worse, and the nitroglycerin and aspirin seem to make them better.        Past Medical History:  Diagnosis Date  . Elevated PSA   . Gout   . Hypertension   . Hypertension   . Prostate cancer [facility])    prostate  . Prostatitis   . 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 Chest pain 07/28/2020  . Unspecified atrial fibrillation ([place]) 07/28/2020  . Malignant neoplasm of prostate ([place]) 05/22/2015  . HTN (hypertension) 01/23/2015  . Cardiac murmur 01/23/2015    Past Surgical History:  Procedure Laterality Date  . HEMORROIDECTOMY    . PROSTATE BIOPSY    . PROSTATE CRYOABLATION  2006    Prior to Admission medications   Medication Sig Start Date End Date Taking? Authorizing Provider  amLODipine (NORVASC) 5 MG tablet Take by mouth. 01/28/18 01/28/19  [provider]  aspirin 81 MG tablet Take 81 mg by mouth daily.    [provider]  hydrochlorothiazide (MICROZIDE) 12.5 MG capsule  04/29/15   [provider]  indomethacin (INDOCIN) 25 MG capsule  07/12/18   [provider]  latanoprost (XALATAN) 0.005 % ophthalmic solution 1 drop at bedtime.    [provider]  lisinopril (PRINIVIL,ZESTRIL) 40 MG tablet Take 40 mg by mouth daily.    [provider]  predniSONE (DELTASONE) 10 MG tablet Take 1 tablet PO daily x 5 days, then 1/2 tablet daily x 2 days 07/30/18   Danton Clap, PA-C    Allergies Patient has no known allergies.  Family History  Problem Relation Age of  Onset  . Hypertension Other   . Prostate cancer Neg Hx   . Bladder Cancer Neg Hx     Social History Social History   Tobacco Use  . Smoking status: Former Research scientist (life sciences)  . Smokeless tobacco: Never Used  Substance Use Topics  . Alcohol use: Yes    Comment: beer on most evenings  . Drug use: No    Review of Systems Constitutional: No fever/chills Eyes: No visual changes. ENT: No sore throat. Cardiovascular: +chest pain. Respiratory: Denies shortness of breath. Gastrointestinal: No abdominal pain.  No nausea, no vomiting.  No diarrhea.  No constipation. Genitourinary: Negative for dysuria. Musculoskeletal: Negative for neck pain.  Negative for back pain. Integumentary:  Negative for rash. Neurological: Negative for headaches, focal weakness or numbness.   ____________________________________________   PHYSICAL EXAM:  VITAL SIGNS: ED Triage Vitals  Enc Vitals Group     BP 07/28/20 0409 (!) 162/56     Pulse Rate 07/28/20 0409 75     Resp 07/28/20 0409 (!) 23     Temp 07/28/20 0409 99.3 F (37.4 C)     Temp Source 07/28/20 0409 Oral     SpO2 07/28/20 0409 98 %     Weight 07/28/20 0403 80.7 kg (178 lb)     Height 07/28/20 0403 1.88 m (6\' 2" )     Head Circumference --      Peak Flow --      Pain Score 07/28/20 0403 4     Pain Loc --      Pain Edu? --      Excl. in [place]? --     Constitutional: Alert and oriented.  Eyes: Conjunctivae are normal.  Head: Atraumatic. Nose: No congestion/rhinnorhea. Mouth/Throat: Patient is wearing a mask. Neck: No stridor.  No meningeal signs.   Cardiovascular: Normal rate, regular rhythm. Good peripheral circulation. Respiratory: Normal respiratory effort.  No retractions. Gastrointestinal: Soft and nontender. No distention.  Musculoskeletal: No lower extremity tenderness nor edema. No gross deformities of extremities. Neurologic:  Normal speech and language. No gross focal neurologic deficits are appreciated.  Skin:  Skin is warm, dry and intact. Psychiatric: Mood and affect are normal. Speech and behavior are normal.  ____________________________________________   LABS (all labs ordered are listed, but only abnormal results are displayed)  Labs Reviewed  CBC WITH DIFFERENTIAL/PLATELET - Abnormal; Notable for the following components:      Result Value   RBC 3.98 (*)    MCV 102.8 (*)    MCH 35.2 (*)    Platelets 131 (*)    All other components within normal limits  COMPREHENSIVE METABOLIC PANEL - Abnormal; Notable for the following components:   Glucose, Bld 123 (*)    BUN 24 (*)    All other components within normal limits  SARS CORONAVIRUS 2 (TAT 6-24 HRS)  MAGNESIUM  PROTIME-INR  APTT   LIPASE, BLOOD  BRAIN NATRIURETIC PEPTIDE  HEPARIN LEVEL (UNFRACTIONATED)  TROPONIN I (HIGH SENSITIVITY)  TROPONIN I (HIGH SENSITIVITY)   ____________________________________________  EKG  ED ECG REPORT  #1 I, Hinda Kehr, the attending physician, personally viewed and interpreted this ECG.  Date: 07/28/2020 EKG Time: 4:06 AM Rate: 73 Rhythm: Atrial fibrillation with frequent PVCs QRS Axis: Left axis deviation Intervals: Abnormal due to A. fib, LVH, frequent PVCs ST/T Wave abnormalities: Non-specific ST segment / T-wave changes, but no clear evidence of acute ischemia. Narrative Interpretation: no definitive evidence of acute ischemia; does not meet STEMI criteria.  ED ECG REPORT  #2  IHinda Kehr, the attending physician, personally viewed and interpreted this ECG.  Date: 07/28/2020 EKG Time: 4:13 AM Rate: 76 Rhythm: Atrial fibrillation QRS Axis: Left axis deviation Intervals: Abnormal due to atrial fibrillation.  Frequent PVCs.  LVH with IVCD ST/T Wave abnormalities: Non-specific ST segment / T-wave changes, but no clear evidence of acute ischemia. Narrative Interpretation: no definitive evidence of acute ischemia; does not meet STEMI criteria.    ____________________________________________  RADIOLOGY I, Hinda Kehr, personally viewed and evaluated these images (plain radiographs) as part of my medical decision making, as well as reviewing the written report by the radiologist.  ED MD interpretation: Chest x-ray pending at the time of admission    ____________________________________________   PROCEDURES   Procedure(s) performed (including Critical Care):  .Critical Care Performed by: Hinda Kehr, MD Authorized by: Hinda Kehr, MD   Critical care provider statement:    Critical care time (minutes):  30   Critical care time was exclusive of:  Separately billable procedures and treating other patients   Critical care was necessary to treat or  prevent imminent or life-threatening deterioration of the following conditions:  Circulatory failure (unstable angina on heparin)   Critical care was time spent personally by me on the following activities:  Development of treatment plan with patient or surrogate, discussions with consultants, evaluation of patient's response to treatment, examination of patient, obtaining history from patient or surrogate, ordering and performing treatments and interventions, ordering and review of laboratory studies, ordering and review of radiographic studies, pulse oximetry, re-evaluation of patient's condition and review of old charts .1-3 Lead EKG Interpretation Performed by: Hinda Kehr, MD Authorized by: Hinda Kehr, MD     Interpretation: abnormal     ECG rate:  75   ECG rate assessment: normal     Rhythm: atrial fibrillation     Ectopy: PVCs     Conduction: normal       ____________________________________________   INITIAL IMPRESSION / MDM / ASSESSMENT AND PLAN / ED COURSE  As part of my medical decision making, I reviewed the following data within the [facility] notes reviewed and incorporated, Labs reviewed , EKG interpreted , Old chart reviewed, Radiograph reviewed , Discussed with admitting physician (Dr. Damita Dunnings)  and Notes from prior ED visits   Differential diagnosis includes, but is not limited to, ACS, A. fib, AAS, PE, less likely pneumonia or other infectious process.  The patient is on the cardiac monitor to evaluate for evidence of arrhythmia and/or significant heart rate changes.  I reviewed the medical record and see no history of atrial fibrillation or other arrhythmia.  I also see no evidence of ACS/CAD but also I could not find any cardiac work-up.  He is having what appears to be episodic chest pain that can be severe that was substantially improved after nitroglycerin.  I am most concerned about ACS.  He has no infectious signs or symptoms and said  he feels much better now after the nitroglycerin.  No abdominal tenderness to palpation.  Generally reassuring physical exam.  EKG shows atrial fibrillation with frequent PVCs.  The initial EKG was interpreted by the computer as an acute MI but I do not believe this is correct, at least not meeting STEMI criteria.  Repeat EKG was similar but no longer being interpreted as acute MI.  Regardless he has an abnormal EKG that does not seem to be his baseline.  Patient is feeling better now with some residual chest discomfort but  it is mild.  Vital signs are generally stable.  He had a very slightly elevated oral temperature of 99.3 but is difficult to interpret what this represents in the absence of any other infectious or respiratory symptoms.  Additionally he has no hypoxemia, tachypnea, nor tachycardia, which is generally reassuring and again points to me more in the direction of ACS.     Clinical Course as of 07/28/20 0947  Sun Jul 28, 2020  0962 Comprehensive metabolic panel(!) Comprehensive metabolic panel is reassuring with no acute abnormalities.  CBC is within normal limits.  Troponin is pending. [CF]  0448 Troponin I (High Sensitivity) Initial high-sensitivity troponin is 12 which is generally reassuring.  However I believe it is likely the patient is suffering from unstable angina given the clinical presentation and I will consult the hospitalist for admission. [CF]  [phone] Patient said the chest pain is better although he still feels some discomfort in his back.  Given the episodic nature of the pain, the improvement with nitroglycerin, the EKG abnormalities, risk factors, etc., I believe he is at a high enough risk for ACS but I will treat him as unstable angina with heparin bolus plus infusion.  He already received full dose aspirin as previously described.  I am consulting the hospitalist for admission.  I discussed this plan with the patient and he understands and agrees. [CF]  0528 Discussed  case by phone with Dr. Damita Dunnings with the hospitalist service who will admit. [CF]  [id number] After my discussion with Dr. Damita Dunnings, I also ordered a chest x-ray to evaluate for any obvious acute abnormality such as infiltrates or widened mediastinum. [CF]    Clinical Course User Index [CF] Hinda Kehr, MD     ____________________________________________  FINAL CLINICAL IMPRESSION(S) / ED DIAGNOSES  Final diagnoses:  Unstable angina ([place])  New onset atrial fibrillation ([facility])  Frequent PVCs  Chest pain, unspecified type     MEDICATIONS GIVEN DURING THIS VISIT:  Medications  heparin ADULT infusion 100 units/mL (25000 units/257mL) (950 Units/hr Intravenous New Bag/Given 07/28/20 0630)  aspirin EC tablet 81 mg (has no administration in time range)  nitroGLYCERIN (NITROSTAT) SL tablet 0.4 mg (0.4 mg Sublingual Given 07/28/20 0618)  acetaminophen (TYLENOL) tablet 650 mg (650 mg Oral Given 07/28/20 0619)  ondansetron (ZOFRAN) injection 4 mg (4 mg Intravenous Given 07/28/20 0702)  0.9 %  sodium chloride infusion ( Intravenous New Bag/Given 07/28/20 0629)  metoprolol tartrate (LOPRESSOR) tablet 12.5 mg (has no administration in time range)  atorvastatin (LIPITOR) tablet 10 mg (has no administration in time range)  morphine 2 MG/ML injection 2 mg (2 mg Intravenous Given 07/28/20 0702)  heparin bolus via infusion 4,000 Units (4,000 Units Intravenous Bolus from Bag 07/28/20 0631)     ED Discharge Orders    None      *Please note:  IZAYA NETHERTON was evaluated in Emergency Department on 07/28/2020 for the symptoms described in the history of present illness. He was evaluated in the context of the global COVID-19 pandemic, which necessitated consideration that the patient might be at risk for infection with the SARS-CoV-2 virus that causes COVID-19. Institutional protocols and algorithms that pertain to the evaluation of patients at risk for COVID-19 are in a state of rapid change based on information  released by regulatory bodies including the CDC and federal and state organizations. These policies and algorithms were followed during the patient's care in the ED.  Some ED evaluations and interventions may be delayed as a result of  limited staffing during and after the pandemic.*  Note:  This document was prepared using Dragon voice recognition software and may include unintentional dictation errors.   Hinda Kehr, MD 07/28/20 [phone]

## 2020-07-28 NOTE — ED Notes (Signed)
Advised nurse that patient has assigned bed 

## 2020-07-28 NOTE — ED Notes (Signed)
Patient clutching chest, restless, moving around bed. Patient states "chest pain, chest pain". Attending messaged regarding this pain.  No new orders.

## 2020-07-28 NOTE — Consult Note (Signed)
Cardiology Consultation:   Patient ID: Jeremiah Lewis MRN: [id number]; DOB: [birth year]  Admit date: 07/28/2020 Date of Consult: 07/28/2020  PCP:  Juanell Fairly, [place]  Cardiologist:  Kathlyn Sacramento, MD  Advanced Practice Provider:  No care team member to display Electrophysiologist:  None [id number]}    Patient Profile:   Jeremiah Lewis is a 87 y.o. male with a hx of hypertension, short-term memory changes currently undergoing work-up, daily alcohol use, prior tobacco use (quit 20 years ago), gout, prostate cancer/prostatitis, rheumatoid arteritis, and who is being seen today for the evaluation of chest pain at the request of Dr. Manuella Ghazi.  History of Present Illness:   Jeremiah Lewis is an 87 yo male with PMH as above.  He normally follows with [facility] for his health.  The below HPI is somewhat limited by the patient's short-term memory issues.  On orientation questions, he is able to correctly state his location, age, name.  He states that he lives at home with his wife.  He denies a past medical history of heart disease or arrhythmia.  He has a remote history of tobacco use, quitting approximately 20 years ago.  He states he does drink alcohol daily - approximately 3 shots of liquor per day.  His father died of a heart attack at age 69.    He states that his chest pain has been ongoing for the last several months.  Each episode lasts anywhere from seconds to minutes but not longer than an hour.  CP is a tightness or pressure in the center of his chest that is pleuritic and worse with deep breathing. No associated cough or SOB. No recent fevers or chills. CP is not TTP and no recent heavy lifting. CP is non-radiating and not positional.  He has not taken any medication for the pain.  No alleviating factors.  No aggravating factors other than deeper breathing.  No association with eating.  No nausea or emesis.  He cannot recall if his first few episodes were with  exertion and states CP occurs both with exertion and at rest. He denies a history of racing heart rate or palpitations; however, on review of care everywhere records, he does have a recent office visit for palpitations.  No presyncope or syncope.  No recent falls.  On exam, lower extremity edema noted, which the patient states is chronic.  He denies any other associated symptoms of volume overload.  Per documentation, he received nitro from EMS with improvement in his chest pain at that time.  During our consultation today, he reports current chest pain.  He states that the pain is coming and going and severe.  He does not feel that each episode is necessarily worse than the previous.  In the ED, initial vitals significant for BP 162/56, HR 75 bpm.  Cr 1.06 with BUN 24, potassium 4.2, LFTs WNL, BNP 646.0, hemoglobin 14.0, moderate 40.9.  Platelets 131.  High-sensitivity troponin 12, 11. EKG showed atrial fibrillation, 76 bpm, LVH, IVCD with LAFB, repolarization changes, PVCs.  COVID-19 test pending.  Chest x-ray with mild diffuse interstitial airspace opacities which could represent multifocal infection versus pulmonary edema.   Past Medical History:  Diagnosis Date  . Elevated PSA   . Gout   . Hypertension   . Hypertension   . Prostate cancer [facility])    prostate  . Prostatitis   . Rheumatoid arteritis ([place])     Past Surgical History:  Procedure Laterality  Date  . HEMORROIDECTOMY    . PROSTATE BIOPSY    . PROSTATE CRYOABLATION  2006     Home Medications:  Prior to Admission medications   Medication Sig Start Date End Date Taking? Authorizing Provider  amLODipine (NORVASC) 5 MG tablet Take by mouth. 01/28/18 01/28/19  [provider]  aspirin 81 MG tablet Take 81 mg by mouth daily.    [provider]  hydrochlorothiazide (MICROZIDE) 12.5 MG capsule  04/29/15   [provider]  indomethacin (INDOCIN) 25 MG capsule  07/12/18   [provider]  latanoprost  (XALATAN) 0.005 % ophthalmic solution 1 drop at bedtime.    [provider]  lisinopril (PRINIVIL,ZESTRIL) 40 MG tablet Take 40 mg by mouth daily.    [provider]  predniSONE (DELTASONE) 10 MG tablet Take 1 tablet PO daily x 5 days, then 1/2 tablet daily x 2 days 07/30/18   Danton Clap, PA-C    Inpatient Medications: Scheduled Meds: . Derrill Memo ON 07/29/2020] aspirin EC  81 mg Oral Daily  . atorvastatin  10 mg Oral Daily  . metoprolol tartrate  12.5 mg Oral BID   Continuous Infusions: . sodium chloride 100 mL/hr at 07/28/20 6073  . heparin 950 Units/hr (07/28/20 0630)   PRN Meds: acetaminophen, morphine injection, nitroGLYCERIN, ondansetron (ZOFRAN) IV  Allergies:   No Known Allergies  Social History:   Social History   Socioeconomic History  . Marital status: Married    Spouse name: Not on file  . Number of children: Not on file  . Years of education: Not on file  . Highest education level: Not on file  Occupational History  . Not on file  Tobacco Use  . Smoking status: Former Research scientist (life sciences)  . Smokeless tobacco: Never Used  Substance and Sexual Activity  . Alcohol use: Yes    Comment: beer on most evenings  . Drug use: No  . Sexual activity: Not on file  Other Topics Concern  . Not on file  Social History Narrative  . Not on file   Social Determinants of Health   Financial Resource Strain: Not on file  Food Insecurity: Not on file  Transportation Needs: Not on file  Physical Activity: Not on file  Stress: Not on file  Social Connections: Not on file  Intimate Partner Violence: Not on file    Family History:   Reports family history of a father that died from a heart attack at age 20. Family History  Problem Relation Age of Onset  . Hypertension Other   . Prostate cancer Neg Hx   . Bladder Cancer Neg Hx      ROS:  Please see the history of present illness.  Review of Systems  Constitutional: Negative for chills, diaphoresis, fever and  malaise/fatigue.  Respiratory: Negative for cough, hemoptysis, shortness of breath and wheezing.   Cardiovascular: Positive for chest pain and leg swelling. Negative for palpitations, orthopnea and PND.  Gastrointestinal: Negative for abdominal pain, blood in stool, constipation, heartburn, melena, nausea and vomiting.  Genitourinary: Negative for hematuria.  Musculoskeletal: Negative for falls.  Neurological: Negative for loss of consciousness.  All other systems reviewed and are negative.   All other ROS reviewed and negative.     Physical Exam/Data:   Vitals:   07/28/20 0530 07/28/20 0700 07/28/20 0730 07/28/20 0800  BP: (!) 174/69 (!) 138/103 (!) 148/55 120/63  Pulse: 85 83 80 80  Resp: 19  18   Temp:  TempSrc:      SpO2: 92% 97% 92% 99%  Weight:      Height:       No intake or output data in the 24 hours ending 07/28/20 0852 Last 3 Weights 07/28/2020 07/30/2018 11/28/2015  Weight (lbs) 178 lb 178 lb 206 lb  Weight (kg) 80.74 kg 80.74 kg 93.441 kg     Body mass index is 22.85 kg/m.  General: Elderly male, no acute distress.  At times appears in pain. HEENT: normal Lymph: no adenopathy Neck: JVP approximately 10-11cm Endocrine:  No thryomegaly Vascular: No carotid bruits; FA pulses 2+ bilaterally without bruits  Cardiac:  normal S1, Z6;OQHU; 1/6 systolic murmur Lungs: Distant breath sounds, poor inspiratory effort, reduced breath sounds at the bases.  Reports pain with deeper breathing. Abd: Somewhat distended, nontender, no hepatomegaly  Ext: 1-2+ bilateral lower extremity edema to mid tibia  Musculoskeletal:  No deformities, BUE and BLE strength normal and equal Skin: warm and dry  Neuro:  CNs 2-12 intact, no focal abnormalities noted.  Able to answer questions correctly to identify location, name, age. Psych:  Normal affect   EKG:  The EKG was personally reviewed and demonstrates:atrial fibrillation, 76 bpm, LVH, IVCD with LAFB, repolarization changes, PVCs.   Telemetry:  Telemetry was personally reviewed and demonstrates:  Afib, PVCs, bigeminy, rates 70-80s  Relevant CV Studies: Pending echo  Laboratory Data:  High Sensitivity Troponin:   Recent Labs  Lab 07/28/20 0405 07/28/20 0630  TROPONINIHS 12 11     Chemistry Recent Labs  Lab 07/28/20 0405  NA 138  K 4.2  CL 106  CO2 23  GLUCOSE 123*  BUN 24*  CREATININE 1.06  CALCIUM 9.4  GFRNONAA >60  ANIONGAP 9    Recent Labs  Lab 07/28/20 0405  PROT 6.9  ALBUMIN 4.1  AST 18  ALT 14  ALKPHOS 61  BILITOT 1.1   Hematology Recent Labs  Lab 07/28/20 0405  WBC 8.4  RBC 3.98*  HGB 14.0  HCT 40.9  MCV 102.8*  MCH 35.2*  MCHC 34.2  RDW 13.6  PLT 131*   BNP Recent Labs  Lab 07/28/20 0405  BNP 646.0*    DDimer No results for input(s): DDIMER in the last 168 hours.   Radiology/Studies:  DG Chest Portable 1 View  Result Date: 07/28/2020 CLINICAL DATA:  Chest pain EXAM: PORTABLE CHEST 1 VIEW COMPARISON:  04/07/2015 FINDINGS: Mild cardiac enlargement. Decreased lung volumes. Mild diffuse increase interstitial and airspace opacities identified bilaterally. No pleural effusions. The visualized osseous structures are unremarkable. IMPRESSION: Mild diffuse increase interstitial and airspace opacities which may represent multifocal infection versus pulmonary edema. Electronically Signed   By: Kerby Moors M.D.   On: 07/28/2020 06:00     Assessment and Plan:   Chest pain, atypical  --Reports current pleuriticchest pain.  Due to his short-term memory issues, some of the details surrounding his CP are unclear, but it does have some atypical factors based on his most recent description of it. High-sensitivity troponin minimally elevated, flat trending (12, 11). EKG currently without acute ST/T changes. Less consistent with ACS at this time. Given previous history of smoking, male, age, hypertension, and family history with father deceased from heart attack at age 54; however,  we cannot rule out CAD and further risk stratification needed.     Obtain echo to assess EF, wall motion, heart pressures, and rule out acute structural abnormalities.   Continue to cycle high-sensitivity troponin.  Serial EKGs.  Continue ASA,  beta-blocker, statin.  Further recommendations pending echo for further risk factor stratification.   Consider that chest pain may be 2/2 new onset atrial fibrillation +/- volume overload with elevated BP.   Still pending COVID-19 results.   Also should consider CP 2/2 pulmonary embolism, which should be considered if current workup unrevealing and per IM. Echo will also assist with looking for right heart strain.  New onset atrial fibrillation with controlled ventricular rate --Denies a previous history of atrial fibrillation.  Consider this as the etiology of his chest pain as above.  Currently, rate is well controlled on BB.    Check TSH.   Daily BMET -replete electrolytes if needed.  Continue current BB for rate control.   Continue current heparin for anticoagulation.    CHA2DS2VASc score of at least 3 (HTN, agex2) pending echo.   Will need transition from IV heparin to oral anticoagulation before discharge.  At that time, recommend discontinue ASA to reduce the risk of bleeding.  No current plan for TEE/DCCV this admission unless rate or sx difficult to control.  He is not therapeutically anticoagulated; however, if still in atrial fibrillation and outpatient follow-up in 1 month, DCCV could be considered at that time.  Volume overload, unknown EF --Reports swelling for some time now and chest tightness.  Consider that any volume overload may be 2/2 new onset atrial fibrillation. --Appears volume up on exam. BNP 646.0.  Suspect he would likely benefit from initiation of gentle diuresis as renal function allows.  Consider IV Lasix 20 mg x 1 with reassessment of renal function following. --Daily BMET.  Monitor renal function  electrolytes.  Care everywhere labs show 03/2019 creatinine 0.98 with BUN 23. --Monitor I's/O's, daily standing weights. --Further recommendations pending echo.  PVCs  --Daily BMET.  Ensure electrolytes at goal.   HTN --Current BP well controlled.  Continue current medications.        HEAR Score (for undifferentiated chest pain):  HEAR Score: 5   New York Heart Association (NYHA) Functional Class NYHA Class I  CHA2DS2-VASc Score = 3  This indicates a 3.2% annual risk of stroke. The patient's score is based upon: CHF History: No HTN History: Yes Diabetes History: No Stroke History: No Vascular Disease History: No Age Score: 2 Gender Score: 0       For questions or updates, please contact [place] Please consult [email or web address] for contact info under    Signed, Arvil Chaco, PA-C  07/28/2020 8:52 AM

## 2020-07-28 NOTE — Progress Notes (Signed)
ANTICOAGULATION CONSULT NOTE - Follow-up  Pharmacy Consult for Heparin  Indication: chest pain/ACS  No Known Allergies  Patient Measurements: Height: 6\' 2"  (188 cm) Weight: 80.7 kg (178 lb) IBW/kg (Calculated) : 82.2 Heparin Dosing Weight: 80.7 kg   Vital Signs: Temp: 98.8 F (37.1 C) (02/20 0915) Temp Source: Oral (02/20 0915) BP: 141/57 (02/20 1330) Pulse Rate: 68 (02/20 1330)  Labs: Recent Labs    07/28/20 0405 07/28/20 0630 07/28/20 1447  HGB 14.0  --   --   HCT 40.9  --   --   PLT 131*  --   --   APTT 31  --   --   LABPROT 14.1  --   --   INR 1.1  --   --   HEPARINUNFRC  --   --  0.10*  CREATININE 1.06  --   --   TROPONINIHS 12 11  --     Estimated Creatinine Clearance: 56 mL/min (by C-G formula based on SCr of 1.06 mg/dL).   Medical History: Past Medical History:  Diagnosis Date  . Elevated PSA   . Gout   . Hypertension   . Hypertension   . Prostate cancer [facility])    prostate  . Prostatitis   . Rheumatoid arteritis ([facility])     Medications:  (Not in a hospital admission)   Assessment: Pharmacy consulted to dose heparin in this 87 year old male admitted with unstable angina.  CrCl = 56 ml/min No prior anticoag noted.   220 1447 HL 0.10, subtherapeutic   Goal of Therapy:  Heparin level 0.3-0.7 units/ml Monitor platelets by anticoagulation protocol: Yes   Plan:  Heparin level subtherapeutic, confirmed with nurse that heparin infusion had not been interrupted at any time prior to level Will give 2400 bolus x 1 and increase heparin infusion to 1200 units/hr Re-check HL in 8 hours and daily while on heparin  Continue to monitor H&H and platelets  Jeremiah Lewis, PharmD, BCPS Clinical Pharmacist  07/28/2020,3:52 PM

## 2020-07-28 NOTE — ED Triage Notes (Signed)
Patient from home via [facility]. Pt c/o L side CP, pressure radiating to back and up to shoulder.  EMS administered nitro and 324 aspirin en route. Patient has hx dementia.

## 2020-07-28 NOTE — ED Notes (Signed)
Pt given pillow at this time and adjusted in bed.

## 2020-07-29 ENCOUNTER — Observation Stay (HOSPITAL_BASED_OUTPATIENT_CLINIC_OR_DEPARTMENT_OTHER)
Admit: 2020-07-29 | Discharge: 2020-07-29 | Disposition: A | Discharge status: Home / Self Care | Payer: Medicare Other | Attending: Physician Assistant | Admitting: Physician Assistant

## 2020-07-29 DIAGNOSIS — I493 Ventricular premature depolarization: Secondary | ICD-10-CM | POA: Diagnosis not present

## 2020-07-29 DIAGNOSIS — I4891 Unspecified atrial fibrillation: Secondary | ICD-10-CM | POA: Diagnosis not present

## 2020-07-29 DIAGNOSIS — R079 Chest pain, unspecified: Secondary | ICD-10-CM

## 2020-07-29 LAB — ECHOCARDIOGRAM COMPLETE
AR max vel: 2.64 cm2
AV Area VTI: 2.21 cm2
AV Area mean vel: 2.24 cm2
AV Mean grad: 8 mmHg
AV Peak grad: 16.5 mmHg
Ao pk vel: 2.03 m/s
Area-P 1/2: 5.38 cm2
Calc EF: 59.3 %
Height: 74 in
P 1/2 time: 368 msec
S' Lateral: 2.7 cm
Single Plane A2C EF: 55 %
Single Plane A4C EF: 62.7 %
Weight: 3012.8 oz

## 2020-07-29 LAB — HEPARIN LEVEL (UNFRACTIONATED): Heparin Unfractionated: 0.1 IU/mL — ABNORMAL LOW (ref 0.30–0.70)

## 2020-07-29 MED ORDER — APIXABAN 5 MG PO TABS: 5.0000 mg | ORAL_TABLET | Freq: Two times a day (BID) | ORAL | 0 refills | Status: DC

## 2020-07-29 MED ORDER — METOPROLOL TARTRATE 25 MG PO TABS: 25.0000 mg | ORAL_TABLET | Freq: Two times a day (BID) | ORAL | 0 refills | Status: DC

## 2020-07-29 MED ORDER — HEPARIN BOLUS VIA INFUSION
2400.0000 [IU] | Freq: Once | INTRAVENOUS | Status: AC
  Administered 2020-07-29: 2400 [IU] via INTRAVENOUS
  Filled 2020-07-29: qty 2400

## 2020-07-29 MED ORDER — APIXABAN 5 MG PO TABS
5.0000 mg | ORAL_TABLET | Freq: Two times a day (BID) | ORAL | Status: DC
  Administered 2020-07-29: 5 mg via ORAL
  Filled 2020-07-29: qty 1

## 2020-07-29 MED ORDER — ATORVASTATIN CALCIUM 10 MG PO TABS: 10.0000 mg | ORAL_TABLET | Freq: Every day | ORAL | 0 refills | Status: DC

## 2020-07-29 MED ORDER — METOPROLOL TARTRATE 25 MG PO TABS
25.0000 mg | ORAL_TABLET | Freq: Two times a day (BID) | ORAL | Status: DC
Start: 2020-07-29 — End: 2020-07-29
  Administered 2020-07-29: 25 mg via ORAL

## 2020-07-29 NOTE — Discharge Instructions (Signed)

## 2020-07-29 NOTE — Progress Notes (Signed)
Progress Note  Patient Name: Jeremiah Lewis Date of Encounter: 07/29/2020  Primary Cardiologist: New to [facility] - consult by Arida  Subjective   No chest pain, palpitations, dyspnea, dizziness, presyncope, or syncope.   Inpatient Medications    Scheduled Meds: . aspirin EC  81 mg Oral Daily  . atorvastatin  10 mg Oral Daily  . metoprolol tartrate  12.5 mg Oral BID  . sodium chloride flush  3 mL Intravenous Q12H   Continuous Infusions: . heparin 1,500 Units/hr (07/29/20 0225)   PRN Meds: acetaminophen, morphine injection, nitroGLYCERIN, ondansetron (ZOFRAN) IV   Vital Signs    Vitals:   07/28/20 2124 07/29/20 0328 07/29/20 0332 07/29/20 0723  BP: (!) 141/50  (!) 152/70 (!) 140/56  Pulse: 71  78 65  Resp: 17  17 18   Temp: 98 F (36.7 C)  99.6 F (37.6 C) 98.4 F (36.9 C)  TempSrc: Oral  Oral   SpO2: 98%  96% 95%  Weight: 85.8 kg 85.4 kg    Height: 6\' 2"  (1.88 m)       Intake/Output Summary (Last 24 hours) at 07/29/2020 0856 Last data filed at 07/29/2020 0330 Gross per 24 hour  Intake 486.51 ml  Output 450 ml  Net 36.51 ml   Filed Weights   07/28/20 0403 07/28/20 2124 07/29/20 0328  Weight: 80.7 kg 85.8 kg 85.4 kg    Telemetry    Afib with frequent PVCs at times in a pattern of bigeminy, 60s to 80s bpm - Personally Reviewed  ECG    No new tracings - Personally Reviewed  Physical Exam   GEN: No acute distress.   Neck: No JVD. Cardiac: IRIR, no murmurs, rubs, or gallops.  Respiratory: Clear to auscultation bilaterally.  GI: Soft, nontender, non-distended.   MS: No edema; No deformity. Neuro:  Alert and oriented x 3; Nonfocal.  Psych: Normal affect.  Labs    Chemistry Recent Labs  Lab 07/28/20 0405  NA 138  K 4.2  CL 106  CO2 23  GLUCOSE 123*  BUN 24*  CREATININE 1.06  CALCIUM 9.4  PROT 6.9  ALBUMIN 4.1  AST 18  ALT 14  ALKPHOS 61  BILITOT 1.1  GFRNONAA >60  ANIONGAP 9     Hematology Recent Labs  Lab 07/28/20 0405  WBC  8.4  RBC 3.98*  HGB 14.0  HCT 40.9  MCV 102.8*  MCH 35.2*  MCHC 34.2  RDW 13.6  PLT 131*    Cardiac EnzymesNo results for input(s): TROPONINI in the last 168 hours. No results for input(s): TROPIPOC in the last 168 hours.   BNP Recent Labs  Lab 07/28/20 0405  BNP 646.0*     DDimer No results for input(s): DDIMER in the last 168 hours.   Radiology    DG Chest Portable 1 View  Result Date: 07/28/2020 IMPRESSION: Mild diffuse increase interstitial and airspace opacities which may represent multifocal infection versus pulmonary edema. Electronically Signed   By: Kerby Moors M.D.   On: 07/28/2020 06:00    Cardiac Studies   2D echo pending  Patient Profile     87 y.o. male with history of HTN, short-term memory changes, alcohol use, prior tobacco use, prostate cancer, RA, and gout who we we are seeing for incidentally noted Afib.   Assessment & Plan    1. Afib: -Uncertain chronicity, EKGs this admission show Afib with controlled ventricular response and PVCs -Increase Lopressor to 25 mg bid -Echo pending -TSH normal -Potassium  at goal -CHADS2VASc 3 -Transition from heparin gtt to Eliquis 5 mg bid, recommend stopping ASA to minimize bleeding risk  For questions or updates, please contact [place] Please consult [email or web address] for contact info under Cardiology/STEMI.    Signed, Christell Faith, PA-C [place] Pager: [phone] 07/29/2020, 8:56 AM

## 2020-07-29 NOTE — Progress Notes (Signed)
Pleasantly confused on arrival to unit.  No family available on transfer to unit. Admission history incomplete at this time.

## 2020-07-29 NOTE — TOC Transition Note (Signed)
Transition of Care [facility]) - CM/SW Discharge Note   Patient Details  Name: Jeremiah Lewis MRN: [id number] Date of Birth: 1930-11-03  [facility] of [facility]) CM/SW Contact:  Kerin Salen, RN Phone Number: 07/29/2020, 10:16 AM   Clinical Narrative: Patient to be discharged home, Eliquis coupon given.      Final next level of care: Home/Self Care Barriers to Discharge: Barriers Resolved   Patient Goals and CMS Choice Patient states their goals for this hospitalization and ongoing recovery are:: To return home.   Choice offered to / list presented to : NA  Discharge Placement                Patient to be transferred to facility by: Family Name of family member notified: Patient states family will come transport him home. Patient and family notified of of transfer: 07/29/20  Discharge Plan and Services                DME Arranged: N/A DME Agency: NA         HH Agency: NA        Social Determinants of Health (SDOH) Interventions     Readmission Risk Interventions No flowsheet data found.

## 2020-07-29 NOTE — Progress Notes (Signed)
*  PRELIMINARY RESULTS* Echocardiogram 2D Echocardiogram has been performed.  Jeremiah Lewis 07/29/2020, 9:40 AM

## 2020-07-29 NOTE — Discharge Summary (Signed)
[facility] [place] NAME: Jeremiah Lewis    MR#:  [id number]  DATE OF BIRTH:  12-02-1930  DATE OF ADMISSION:  07/28/2020   ADMITTING PHYSICIAN: Athena Masse, MD  DATE OF DISCHARGE: 07/29/2020  1:11 PM  PRIMARY CARE PHYSICIAN: Juanell Fairly, MD   ADMISSION DIAGNOSIS:  Unstable angina ([place]) [I20.0] New onset atrial fibrillation ([place]) [I48.91] Frequent PVCs [I49.3] Chest pain [R07.9] Chest pain, unspecified type [R07.9] DISCHARGE DIAGNOSIS:  Principal Problem:   Chest pain Active Problems:   HTN (hypertension)   Unspecified atrial fibrillation (HCC)   Frequent PVCs  SECONDARY DIAGNOSIS:   Past Medical History:  Diagnosis Date  . Elevated PSA   . Gout   . Hypertension   . Hypertension   . Prostate cancer [facility])    prostate  . Prostatitis   . 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 Remained afebrile in the hospital -Covid neg  Permanent atrial fibrillation (HCC) -EKG showing A. fib/flutter rate of 76 with several PVCs, with no prior history of A. fib -Rate controlled -CHA2DS2-VASc score of 3 - initially started on heparing with transition to PO eliquis for systemic anticoagulation for stroke prevention and stopping asa to minimize bleeding risk -increased dose of metoprolol to 25 mg po bid at DC  Mild dialstolic CHF -per cardio, outpt eval as patient is not much symptomatic from this and would like to go home.    HTN (hypertension) -BP controlled.    DISCHARGE CONDITIONS:  stable CONSULTS OBTAINED:  Treatment Team:  Wellington Hampshire, MD DRUG ALLERGIES:  No Known Allergies DISCHARGE MEDICATIONS:   Allergies as of 07/29/2020   No Known Allergies     Medication List    STOP taking these medications   amLODipine 5 MG tablet Commonly known as: NORVASC   aspirin 81 MG tablet   lisinopril 40 MG tablet Commonly known as: ZESTRIL     TAKE these medications   apixaban 5 MG Tabs tablet Commonly known as: ELIQUIS Take 1 tablet (5 mg total) by mouth 2 (two) times daily.   atorvastatin 10 MG tablet Commonly known as: LIPITOR Take 1 tablet (10 mg total) by mouth daily. Start taking on: July 30, 2020   metoprolol tartrate 25 MG tablet Commonly known as: LOPRESSOR Take 1 tablet (25 mg total) by mouth 2 (two) times daily.      DISCHARGE INSTRUCTIONS:   DIET:  Cardiac diet DISCHARGE CONDITION:  Stable ACTIVITY:  Activity as tolerated OXYGEN:  Home Oxygen: No.  Oxygen Delivery: room air DISCHARGE LOCATION:  home   If you experience worsening of your admission symptoms, develop shortness of breath, life threatening emergency, suicidal or homicidal thoughts you must seek medical attention immediately by calling 911 or calling your MD immediately  if symptoms less severe.  You Must read complete instructions/literature along with all the possible adverse reactions/side effects for all the Medicines you take and that have been prescribed to you. Take any new Medicines after you have completely understood and accpet all the possible adverse reactions/side effects.   Please  note  You were cared for by a hospitalist during your hospital stay. If you have any questions about your discharge medications or the care you received while you were in the hospital after you are discharged, you can call the unit and asked to speak with the hospitalist on call if the hospitalist that took care of you is not available. Once you are  discharged, your primary care physician will handle any further medical issues. Please note that NO REFILLS for any discharge medications will be authorized once you are discharged, as it is imperative that you return to your primary care physician (or establish a relationship with a primary care physician if you do not have one) for your aftercare needs so that they can reassess your need for medications and monitor your lab values.    On the day of Discharge:  VITAL SIGNS:  Blood pressure (!) 140/56, pulse 65, temperature 98.4 F (36.9 C), resp. rate 18, height 6\' 2"  (1.88 m), weight 85.4 kg, SpO2 95 %. PHYSICAL EXAMINATION:  GENERAL:  87 y.o.-year-old patient lying in the bed with no acute distress.  EYES: Pupils equal, round, reactive to light and accommodation. No scleral icterus. Extraocular muscles intact.  HEENT: Head atraumatic, normocephalic. Oropharynx and nasopharynx clear.  NECK:  Supple, no jugular venous distention. No thyroid enlargement, no tenderness.  LUNGS: Normal breath sounds bilaterally, no wheezing, rales,rhonchi or crepitation. No use of accessory muscles of respiration.  CARDIOVASCULAR: S1, S2 normal. No murmurs, rubs, or gallops.  ABDOMEN: Soft, non-tender, non-distended. Bowel sounds present. No organomegaly or mass.  EXTREMITIES: No pedal edema, cyanosis, or clubbing.  NEUROLOGIC: Cranial nerves II through XII are intact. Muscle strength 5/5 in all extremities. Sensation intact. Gait not checked.  PSYCHIATRIC: The patient is alert and oriented x 3.  SKIN: No obvious rash, lesion, or ulcer.  DATA REVIEW:   CBC Recent Labs  Lab 07/28/20 0405  WBC 8.4  HGB 14.0  HCT 40.9  PLT 131*    Chemistries  Recent Labs  Lab 07/28/20 0405  NA 138  K 4.2  CL 106  CO2 23  GLUCOSE 123*  BUN 24*  CREATININE 1.06  CALCIUM 9.4  MG 2.2  AST 18  ALT 14  ALKPHOS 61  BILITOT 1.1     Outpatient follow-up  Follow-up Information    Juanell Fairly, MD. Schedule an  appointment as soon as possible for a visit in 1 week.   Specialty: Pediatrics Contact information: [place] [address] [phone]        Wellington Hampshire, MD. Schedule an appointment as soon as possible for a visit on 08/08/2020.   Specialty: Cardiology Why: @ 2:30 PATIENT WILL SEE THE PA Contact information: [address] [place] [address] [phone]                  Management plans discussed with the patient, family and they are in agreement.  CODE STATUS: Prior   TOTAL TIME TAKING CARE OF THIS PATIENT: 45 minutes.    Max Sane M.D on 07/29/2020 at 8:58 PM  [facility]   CC: Primary care physician; Juanell Fairly, MD   Note: This dictation was prepared with Dragon dictation along with smaller phrase technology. Any transcriptional errors that result from this process are unintentional.

## 2020-07-29 NOTE — Progress Notes (Signed)
ANTICOAGULATION CONSULT NOTE - Follow-up  Pharmacy Consult for Heparin  Indication: chest pain/ACS  No Known Allergies  Patient Measurements: Height: 6\' 2"  (188 cm) Weight: 85.4 kg (188 lb 4.8 oz) IBW/kg (Calculated) : 82.2 Heparin Dosing Weight: 80.7 kg   Vital Signs: Temp: 98.4 F (36.9 C) (02/21 0723) Temp Source: Oral (02/21 0332) BP: 140/56 (02/21 0723) Pulse Rate: 65 (02/21 0723)  Labs: Recent Labs    07/28/20 0405 07/28/20 0630 07/28/20 1447 07/29/20 0046  HGB 14.0  --   --   --   HCT 40.9  --   --   --   PLT 131*  --   --   --   APTT 31  --   --   --   LABPROT 14.1  --   --   --   INR 1.1  --   --   --   HEPARINUNFRC  --   --  0.10* 0.10*  CREATININE 1.06  --   --   --   TROPONINIHS 12 11  --   --     Estimated Creatinine Clearance: 57.1 mL/min (by C-G formula based on SCr of 1.06 mg/dL).   Medical History: Past Medical History:  Diagnosis Date  . Elevated PSA   . Gout   . Hypertension   . Hypertension   . Prostate cancer [facility])    prostate  . Prostatitis   . Rheumatoid arteritis (HCC)     Medications:  Medications Prior to Admission  Medication Sig Dispense Refill Last Dose  . amLODipine (NORVASC) 5 MG tablet Take by mouth.     Marland Kitchen lisinopril (PRINIVIL,ZESTRIL) 40 MG tablet Take 40 mg by mouth daily.     Marland Kitchen lisinopril (ZESTRIL) 40 MG tablet Take 1 tablet by mouth daily.     Marland Kitchen aspirin 81 MG tablet Take 81 mg by mouth daily.       Assessment: Pharmacy consulted to dose heparin in this 87 year old male admitted with unstable angina and new onset AFib.  CrCl = 56 ml/min No prior anticoag noted.   220 1447 HL 0.10, subtherapeutic   Goal of Therapy:  Heparin level 0.3-0.7 units/ml Monitor platelets by anticoagulation protocol: Yes   Plan:  2/21:  HL @ 0046 = 0.1 2/21: stopped hep gtt and transitioned to apixaban 5mg  BID (for Afib w/ only 1 of 3 criteria)  Pharmacy signing off. Please reconsult if needed. Will continue to monitor DOAC per  protocol. Lorna Dibble, PharmD Clinical Pharmacist  07/29/2020,12:55 PM

## 2020-07-29 NOTE — Progress Notes (Signed)
ANTICOAGULATION CONSULT NOTE - Follow-up  Pharmacy Consult for Heparin  Indication: chest pain/ACS  No Known Allergies  Patient Measurements: Height: 6\' 2"  (188 cm) Weight: 85.8 kg (189 lb 3.2 oz) IBW/kg (Calculated) : 82.2 Heparin Dosing Weight: 80.7 kg   Vital Signs: Temp: 98 F (36.7 C) (02/20 2124) Temp Source: Oral (02/20 2124) BP: 141/50 (02/20 2124) Pulse Rate: 71 (02/20 2124)  Labs: Recent Labs    07/28/20 0405 07/28/20 0630 07/28/20 1447 07/29/20 0046  HGB 14.0  --   --   --   HCT 40.9  --   --   --   PLT 131*  --   --   --   APTT 31  --   --   --   LABPROT 14.1  --   --   --   INR 1.1  --   --   --   HEPARINUNFRC  --   --  0.10* 0.10*  CREATININE 1.06  --   --   --   TROPONINIHS 12 11  --   --     Estimated Creatinine Clearance: 57.1 mL/min (by C-G formula based on SCr of 1.06 mg/dL).   Medical History: Past Medical History:  Diagnosis Date  . Elevated PSA   . Gout   . Hypertension   . Hypertension   . Prostate cancer [facility])    prostate  . Prostatitis   . Rheumatoid arteritis (HCC)     Medications:  Medications Prior to Admission  Medication Sig Dispense Refill Last Dose  . amLODipine (NORVASC) 5 MG tablet Take by mouth.     Marland Kitchen lisinopril (PRINIVIL,ZESTRIL) 40 MG tablet Take 40 mg by mouth daily.     Marland Kitchen lisinopril (ZESTRIL) 40 MG tablet Take 1 tablet by mouth daily.     Marland Kitchen aspirin 81 MG tablet Take 81 mg by mouth daily.       Assessment: Pharmacy consulted to dose heparin in this 87 year old male admitted with unstable angina.  CrCl = 56 ml/min No prior anticoag noted.   220 1447 HL 0.10, subtherapeutic   Goal of Therapy:  Heparin level 0.3-0.7 units/ml Monitor platelets by anticoagulation protocol: Yes   Plan:  2/21:  HL @ 0046 = 0.1 Will order Heparin 2400 units IV X 1 and increase drip rate to 1500 units/hr. Will recheck HL 8 hrs after rate change.   Orene Desanctis, PharmD Clinical Pharmacist  07/29/2020,2:18 AM

## 2020-07-30 ENCOUNTER — Telehealth: Payer: Self-pay | Admitting: Cardiovascular Disease

## 2020-07-30 NOTE — Telephone Encounter (Signed)
Daughter calling back in with BP and HR after medication BP at 1 pm 125/73  62 and irregular (hx of a fib) Later after nap HR was 53

## 2020-07-30 NOTE — Telephone Encounter (Signed)
Patients daughter calling in after patient was discharged yesterday. Patient was prescribed 3 new medications including lopressor. Patient took all medications and then 5 hours later complained of weakness. Patient wants to know if the dose could be altered or what would be best  Please advise

## 2020-07-30 NOTE — Telephone Encounter (Signed)
Spoke with the patients daughter Olegario Messier. Advised her that the reported BP and HR below are ok.  Olegario Messier has been with the patient all day and sts that he is doing fine. The patient has dementia and he is not always a reliable source with reporting symptoms. The patient wife sts that she thinks the pt may have exaggerated how he was feeling. He does not remember the episode the night before.  Pt is scheduled to see his pcp tomorrow. Frederik Pear that they can continue to monitor the pt symptoms and try to get VS when he reports feeling poorly. The pt will f/u as planned. Frederik Pear to contact the office sooner if cardiac symptoms develop.

## 2020-07-30 NOTE — Telephone Encounter (Signed)
Spoke with the patient daughter Jeremiah Lewis.  Jeremiah Lewis sts that the patient reported feeling weak last night around 9:30pm. She was not with the patient and the pt VS were not checked.  Jeremiah Lewis is out to the patients home this morning. The patient is doing ok this morning and is out walking. He has not taken his morning medications. [name] Ok to have the patient take his morning medications and to monitor his BP and HR 2-3 hours after his meds and to call to report the pts BP and HR.  Jeremiah Lewis is agreeable with the plan and they will call back to provide an update.

## 2020-08-06 ENCOUNTER — Telehealth: Payer: Self-pay

## 2020-08-06 NOTE — Telephone Encounter (Signed)
Spoke with patient's daughter Juliann Pulse and scheduled an in-person Palliative Consult for 08/29/20 @ 11AM. Daughter requested this day/time.  COVID screening was negative. Two cats in the home. Patient lives with wife. Daughter will be at consult. Patient & wife are both hard of hearing.   Consent obtained; updated Outlook/Netsmart/Team List and Epic.  Family is aware they may be receiving a call from NP the day before or day of to confirm appointment.

## 2020-08-08 ENCOUNTER — Other Ambulatory Visit
Admission: RE | Admit: 2020-08-08 | Discharge: 2020-08-08 | Disposition: A | Discharge status: Home / Self Care | Payer: Medicare Other | Source: Ambulatory Visit | Attending: Physician Assistant | Admitting: Physician Assistant

## 2020-08-08 ENCOUNTER — Other Ambulatory Visit: Payer: Self-pay

## 2020-08-08 ENCOUNTER — Telehealth: Payer: Self-pay | Admitting: Physician Assistant

## 2020-08-08 ENCOUNTER — Ambulatory Visit: Payer: Medicare Other | Admitting: Physician Assistant

## 2020-08-08 ENCOUNTER — Encounter: Payer: Self-pay | Admitting: Physician Assistant

## 2020-08-08 VITALS — BP 164/66 | HR 58 | Ht 74.0 in | Wt 187.0 lb

## 2020-08-08 DIAGNOSIS — I080 Rheumatic disorders of both mitral and aortic valves: Secondary | ICD-10-CM | POA: Diagnosis not present

## 2020-08-08 DIAGNOSIS — I493 Ventricular premature depolarization: Secondary | ICD-10-CM | POA: Diagnosis not present

## 2020-08-08 DIAGNOSIS — I10 Essential (primary) hypertension: Secondary | ICD-10-CM | POA: Insufficient documentation

## 2020-08-08 DIAGNOSIS — I4891 Unspecified atrial fibrillation: Secondary | ICD-10-CM | POA: Diagnosis present

## 2020-08-08 DIAGNOSIS — I5031 Acute diastolic (congestive) heart failure: Secondary | ICD-10-CM

## 2020-08-08 DIAGNOSIS — R079 Chest pain, unspecified: Secondary | ICD-10-CM | POA: Diagnosis present

## 2020-08-08 DIAGNOSIS — R011 Cardiac murmur, unspecified: Secondary | ICD-10-CM | POA: Insufficient documentation

## 2020-08-08 LAB — BASIC METABOLIC PANEL
Anion gap: 10 (ref 5–15)
BUN: 26 mg/dL — ABNORMAL HIGH (ref 8–23)
CO2: 23 mmol/L (ref 22–32)
Calcium: 9.5 mg/dL (ref 8.9–10.3)
Chloride: 105 mmol/L (ref 98–111)
Creatinine, Ser: 1.28 mg/dL — ABNORMAL HIGH (ref 0.61–1.24)
GFR, Estimated: 54 mL/min — ABNORMAL LOW (ref >60)
Glucose, Bld: 126 mg/dL — ABNORMAL HIGH (ref 70–99)
Potassium: 5 mmol/L (ref 3.5–5.1)
Sodium: 138 mmol/L (ref 135–145)

## 2020-08-08 LAB — BRAIN NATRIURETIC PEPTIDE: B Natriuretic Peptide: 1559 pg/mL — ABNORMAL HIGH (ref 0.0–100.0)

## 2020-08-08 LAB — CBC
HCT: 43.3 % (ref 39.0–52.0)
Hemoglobin: 14.6 g/dL (ref 13.0–17.0)
MCH: 34.7 pg — ABNORMAL HIGH (ref 26.0–34.0)
MCHC: 33.7 g/dL (ref 30.0–36.0)
MCV: 102.9 fL — ABNORMAL HIGH (ref 80.0–100.0)
Platelets: 250 10*3/uL (ref 150–400)
RBC: 4.21 MIL/uL — ABNORMAL LOW (ref 4.22–5.81)
RDW: 14.2 % (ref 11.5–15.5)
WBC: 9.7 10*3/uL (ref 4.0–10.5)
nRBC: 0 % (ref 0.0–0.2)

## 2020-08-08 LAB — HEPATIC FUNCTION PANEL
ALT: 28 U/L (ref 0–44)
AST: 26 U/L (ref 15–41)
Albumin: 4.1 g/dL (ref 3.5–5.0)
Alkaline Phosphatase: 46 U/L (ref 38–126)
Bilirubin, Direct: 0.3 mg/dL — ABNORMAL HIGH (ref 0.0–0.2)
Indirect Bilirubin: 0.9 mg/dL (ref 0.3–0.9)
Total Bilirubin: 1.2 mg/dL (ref 0.3–1.2)
Total Protein: 6.6 g/dL (ref 6.5–8.1)

## 2020-08-08 LAB — LIPID PANEL
Cholesterol: 134 mg/dL (ref 0–200)
HDL: 54 mg/dL (ref >40)
LDL Cholesterol: 64 mg/dL (ref 0–99)
Total CHOL/HDL Ratio: 2.5 RATIO
Triglycerides: 79 mg/dL (ref <150)
VLDL: 16 mg/dL (ref 0–40)

## 2020-08-08 MED ORDER — TORSEMIDE 20 MG PO TABS: 10.0000 mg | ORAL_TABLET | Freq: Every day | ORAL | 3 refills | Status: AC

## 2020-08-08 NOTE — Progress Notes (Signed)
Office Visit    Patient Name: Jeremiah Lewis Date of Encounter: 08/08/2020  PCP:  Romualdo Bolk, [place]  Cardiologist:  Kathlyn Sacramento, MD  Advanced Practice Provider:  No care team member to display Electrophysiologist:  None  :[id number]}   Chief Complaint    Chief Complaint  Patient presents with  . New Patient (Initial Visit)    [place] Follow up - New onset Afib. Meds reviewed verbally with patient.     87 year old male with history of new onset atrial fibrillation, hypertension, short-term memory changes, daily alcohol use, prior tobacco use, gout, prostate cancer/prostatitis, rheumatoid arteriolitis, and who is being seen today for follow-up after recent hospital admission with heart care consulted 07/28/2020.  Past Medical History    Past Medical History:  Diagnosis Date  . Elevated PSA   . Gout   . Hypertension   . Hypertension   . Prostate cancer [facility])    prostate  . Prostatitis   . Rheumatoid arteritis ([place])    Past Surgical History:  Procedure Laterality Date  . HEMORROIDECTOMY    . PROSTATE BIOPSY    . PROSTATE CRYOABLATION  2006    Allergies  No Known Allergies  History of Present Illness    Jeremiah Lewis is a 87 y.o. male with  PMH as above.  He normally follows with [facility] for his health.  The below HPI is somewhat limited by the patient's short-term memory issues.  On orientation questions, he is able to correctly state his location, age, name.  He states that he lives at home with his wife.  He denies a past medical history of heart disease or arrhythmia.  He has a remote history of tobacco use, quitting approximately 20 years ago.  He states he does drink alcohol daily - approximately 3 shots of liquor per day.  His father died of a heart attack at age 86.    He was recently seen at [facility] 07/28/2020 for CP over the several months and found to have atrial fibrillation.  CP was both exertional and  nonexertional and a tightness or pressure in the center of his chest that was pleuritic and worse with deep breathing. He denied palpitations, though these were noted noted in his EMR.  At presentation in the ED, he was found to have atrial fibrillation and elevated BP.  On exam, lower extremity edema noted, reported as chronic. In the ED, BP 162/56, HR 75 bpm.  High-sensitivity troponin 12, 11. Chest x-ray with mild diffuse interstitial airspace opacities which could represent multifocal infection versus pulmonary edema.  Echo obtained during admission showed normal LV SF, mild MR, moderate AR, mild tricuspid regurgitation, moderate pulmonary hypertension.  It was thought that his atrial fibrillation was likely permanent.  Recommendation was for anticoagulation with Eliquis 5 mg twice daily.  Metoprolol was increased to 25 mg twice daily.  It was noted that he was likely mildly volume overloaded; however, given he denied dyspnea, diuresis was deferred.  It was noted he could be reevaluated in the outpatient setting to consider small dose torsemide if needed.  Discharge weight 85.4kg,  On 2/23, he was seen by his PCP for possible UTI. He reported back pain. UA thought not consistent with UTI. He was started on prednisone 2/24 with recommendation for one 20mg  tablet x5d then 1/2 of the tablet for another 5 days and will thus be finished with his course of prednisone soon. He is prescribed  PRN Voltaren gel, which we reviewed today as not recommended from a cardiovascular standpoint.  Today, 08/08/2020, he returns to clinic and reports he is doing well from a cardiac standpoint.  He is joined today by his daughter. EKG shows atrial fibrillation with ongoing PVCs and ventricular rate 58 bpm.  He denies any tachypalpitations.  He also denies any further chest pain or tightness.  No reported presyncope or syncope.  No loss of consciousness.  He denies any signs or symptoms of volume overload. LEE noted on exam but not  noticed by pt before the exam.  No reported shortness of breath, dyspnea, PND, abdominal distention, or early satiety reported.  He reports that he is tolerating his Eliquis well.  During today's exam, a cut is noted on his left index finger.  I cleaned this cut and applied a bandage.  The bleeding slowed with pressure.  He denies any other signs or symptoms of bleeding.  He reports medication compliance .He was recently started on prednisone by his PCP.  We discussed that prednisone can often exacerbate A. fib and worsening volume status with patient and daughter understanding.  Also noted is that he is on Voltaren gel with recommendation he discontinue this gel to reduce the risk of bleeding. He continues to drink alcohol, reporting 2-3 shots of alcohol per evening.  We discussed cutting back on this today. He quit smoking 50 years ago and chewing tobacco 20 to 30 years ago and has not restarted.  He reports 1 cup of coffee every morning.  We reviewed a heart healthy diet and fluid/salt restrictions, given his elevated BP during today's visit.  We also reviewed his hospitalization and echo results together. His daughter reports some hesitation to start diuresis, given she knows this may exacerbate his gout.  We discussed colchicine and allopurinol for gout treatment, rather than prednisone in the future.  Weight today 187 pounds or 84.8 kg with discharge weight 85.4 kg with consideration of different scales.  BP 164/66 with patient BP from home showing BP more controlled with home cuff.  We discussed bringing his home cuff into the office at his upcoming visits.  He brings in a blood pressure and heart rate log with SBP 125-1 46 and DBP 60s to 80s.  Ventricular rate 50s to high 60s. He did state that he feels BP is higher in the office due to white coat or anxiety associated with seeing providers.  Home Medications    Current Outpatient Medications on File Prior to Visit  Medication Sig Dispense Refill  .  apixaban (ELIQUIS) 5 MG TABS tablet Take 1 tablet (5 mg total) by mouth 2 (two) times daily. 60 tablet 0  . atorvastatin (LIPITOR) 10 MG tablet Take 1 tablet (10 mg total) by mouth daily. 30 tablet 0  . diclofenac Sodium (VOLTAREN) 1 % GEL Apply 2 g topically 4 (four) times daily.    . metoprolol tartrate (LOPRESSOR) 25 MG tablet Take 1 tablet (25 mg total) by mouth 2 (two) times daily. 60 tablet 0  . predniSONE (DELTASONE) 20 MG tablet Take 1 tablet in morning for 5 days then 1/2 tablet for 5 days. Repeat as needed for acute arthritic pain.     No current facility-administered medications on file prior to visit.    Review of Systems    He denies chest pain, palpitations, dyspnea, pnd, orthopnea, n, v, dizziness, syncope, edema, weight gain, or early satiety. Edema is noted on exam but not noticed by pt. No  s/sx of bleeding.  All other systems reviewed and are otherwise negative except as noted above.  Physical Exam    VS:  BP (!) 164/66 (BP Location: Left Arm, Patient Position: Sitting, Cuff Size: Normal)   Pulse (!) 58   Ht 6\' 2"  (1.88 m)   Wt 187 lb (84.8 kg)   BMI 24.01 kg/m  , BMI Body mass index is 24.01 kg/m. GEN: Well nourished, well developed, in no acute distress. Joined by his daughter. HEENT: normal. Neck: Supple, JVP ~9-10cm, carotid bruits, or masses. Cardiac:IRIR with extrasystole and bradycardic ventricular rate, 1/6 systolic murmur. No rubs, or gallops. No clubbing, cyanosis. Moderate to 1+ bilateral edema.  Radials/DP/PT 2+ and equal bilaterally.  Respiratory:  Respirations regular and unlabored, clear to auscultation bilaterally. GI: Soft, nontender, nondistended, BS + x 4. MS: no deformity or atrophy. Skin: warm and dry, no rash. Neuro:  Strength and sensation are intact. Psych: Normal affect.  Accessory Clinical Findings    ECG personally reviewed by me today - Afib with slow ventricular response at 58bpm, PVCs, IVCD, QTc 435. Reviewed with DOD same day. TWI in  lead I, LAD - no acute changes.  VITALS Reviewed today   Temp Readings from Last 3 Encounters:  07/29/20 98.4 F (36.9 C)  07/30/18 98.2 F (36.8 C) (Oral)  04/20/15 98 F (36.7 C) (Oral)   BP Readings from Last 3 Encounters:  08/08/20 (!) 164/66  07/29/20 (!) 140/56  07/30/18 (!) 148/60   Pulse Readings from Last 3 Encounters:  08/08/20 (!) 58  07/29/20 65  07/30/18 77    Wt Readings from Last 3 Encounters:  08/08/20 187 lb (84.8 kg)  07/29/20 188 lb 4.8 oz (85.4 kg)  07/30/18 178 lb (80.7 kg)     LABS  reviewed today   Lab Results  Component Value Date   WBC 9.7 08/08/2020   HGB 14.6 08/08/2020   HCT 43.3 08/08/2020   MCV 102.9 (H) 08/08/2020   PLT 250 08/08/2020   Lab Results  Component Value Date   CREATININE 1.28 (H) 08/08/2020   BUN 26 (H) 08/08/2020   NA 138 08/08/2020   K 5.0 08/08/2020   CL 105 08/08/2020   CO2 23 08/08/2020   Lab Results  Component Value Date   ALT 28 08/08/2020   AST 26 08/08/2020   ALKPHOS 46 08/08/2020   BILITOT 1.2 08/08/2020   Lab Results  Component Value Date   CHOL 134 08/08/2020   HDL 54 08/08/2020   LDLCALC 64 08/08/2020   TRIG 79 08/08/2020   CHOLHDL 2.5 08/08/2020    Lab Results  Component Value Date   HGBA1C 4.9 07/28/2020   Lab Results  Component Value Date   TSH 2.167 07/28/2020     STUDIES/PROCEDURES reviewed today   Echo 07/29/20 1. Left ventricular ejection fraction, by estimation, is 55 to 60%. The  left ventricle has normal function. The left ventricle has no regional  wall motion abnormalities. There is mild left ventricular hypertrophy.  Left ventricular diastolic parameters  are indeterminate. The average left ventricular global longitudinal strain  is -10.0 %.  2. Right ventricular systolic function is normal. The right ventricular  size is normal. There is moderately elevated pulmonary artery systolic  pressure. The estimated right ventricular systolic pressure is 40.0 mmHg.  3.  Left atrial size was moderately dilated.  4. The mitral valve is normal in structure. Mild to moderate mitral valve  regurgitation. No evidence of mitral stenosis.  5. The  aortic valve is abnormal. Aortic valve regurgitation is moderate.  Mild aortic valve stenosis.  6. The inferior vena cava is dilated in size with <50% respiratory  variability, suggesting right atrial pressure of 15 mmHg.   Assessment & Plan    Atrial fibrillation with controlled to bradycardic ventricular rate PVCs, asymptomatic --Asymptomatic.  Remains in atrial fibrillation with frequent PVCs.  EKG reviewed with DOD today.  Continue current metoprolol 25 mg twice daily.  Recommend against prednisone in the future and given his comorbid conditions. He was started on anticoagulation with Eliquis 5 mg twice daily given a CHA2DS2-VASc score of at least 3 (hypertension, age x2).  We discussed the recommendation to avoid NSAIDs and Voltaren gel. Will provide pt with Eliquis card, given request for card given cost of medication. Discussed that samples can also be provided in the future if needed. We will recheck a BMET and CBC today.  We discussed possible cardioversion; however, at this time preference is for medical management if patient is asymptomatic at this time. Given he is asx with controlled ventricular rate, it is reasonable to plan for medical management only at this time. Of note, pt will be therapeutically anticoagulated 08/25/20. If he develops sx in the future, we can revisit DCCV but will defer for now.   AOC diastolic heart failure --Denies s/sx of heart failure. [facility] echo as above with nl LVSF and elevated RVSP with RAP 54mmHg. Likely mildly volume up on exam, similar to at time of discharge, and given recent prednisone. BP elevated. Wt similar to that of discharge. Will recheck a BNP and BMET with further recommendations at that time regarding diuresis. Remain hydrated but with all fluid under 2L daily and salt  under 2g daily. Discussed that prednisone can often exacerbate volume status.  *Update: repeat BNP shows elevation from admission with BNP 646.0  1,559.0. Prednisone can elevate BNP and lead to fluid retention. Given hyperkalemia, will defer starting losartan 12.5mg . Renal function still within baseline range on review of Care[facility]where labs. Msg sent to office to call the pt and instruct him we will not start losartan. Instead, start torsemide 10mg  daily with repeat BMET within 1 week. He should call the office if he does not tolerate torsemide well. He should also follow-up with his PCP if any further UTI sx.   Valvular dz --Asx. Recent echo as above with mild to moderate MR, moderate AR, mild AS. Periodic echo to monitor valvular function.   Hypertension --BP today suboptimal at 164/66 with goal BP 130/80 or lower. Discussed salt and fluids. Remain hydrated with fluids under 2L daily and salt under 2g daily. Avoid NSAIDs as above. Gven today's labs, we will start torsemide 10mg  daily instead of Losartan with repeat BMET in 1 week.   EtOH  --Cessation recommended.   ?UTI --Pt recently seen by PCP for possible UTI. UA thought not consistent with UTI. Low threshold for repeat UA. Defer to PCP.   Medication changes: Start torsemide 10mg  daily.  Eliquis card for patient. Labs ordered: BMET in 1 week after starting torsemide.  Studies / Imaging ordered: None Future considerations: Pending labs  Disposition: RTC 1 - 3 months, depending on repeat labs after start of torsemide    Arvil Chaco, PA-C 08/08/2020

## 2020-08-08 NOTE — Patient Instructions (Addendum)
Medication Instructions:  Your physician recommends that you continue on your current medications as directed. Please refer to the Current Medication list given to you today.  *If you need a refill on your cardiac medications before your next appointment, please call your pharmacy*   Lab Work:  1)  Your physician recommends that you have lab work TODAY at the medical mall:  Bmet, CBC, BNP -  Please go to the [facility]. You will check in at the front desk to the right as you walk into the atrium.   2) Your physician recommends that you have labs in [place] at your PCP's office (Lab orders given today): Lipid, Liver panel, Bmet   Testing/Procedures: None ordered   Follow-Up: At [facility], you and your health needs are our priority.  As part of our continuing mission to provide you with exceptional heart care, we have created designated Provider Care Teams.  These Care Teams include your primary Cardiologist (physician) and Advanced Practice Providers (APPs -  Physician Assistants and Nurse Practitioners) who all work together to provide you with the care you need, when you need it.  We recommend signing up for the patient portal called "MyChart".  Sign up information is provided on this After Visit Summary.  MyChart is used to connect with patients for Virtual Visits (Telemedicine).  Patients are able to view lab/test results, encounter notes, upcoming appointments, etc.  Non-urgent messages can be sent to your provider as well.   To learn more about what you can do with MyChart, go to [email or web address].    Your next appointment:   3 month(s)  The format for your next appointment:   In Person  Provider:   You may see Kathlyn Sacramento, MD or one of the following Advanced Practice Providers on your designated Care Team:    Murray Hodgkins, NP  Christell Faith, PA-C  Marrianne Mood, PA-C  Cadence Kathlen Mody, [place]  Laurann Montana, NP    Other Instructions  Call  the office if you have continued chest pain, shortness of breath, or dizziness.  Additional Information --If possible, avoid prednisone going forward, as this can cause racing heart rate and palpitations.  Recommend consider colchicine or allopurinol for your gout. --Avoid NSAIDs, including Voltaren gel. --We will send your follow-up labs to your primary care physician.  Blood pressure: Goal BP is 130/80 or lower.  Check your device's accuracy against an office model once a year if possible. You can do so by bringing your cuff into the office and notifying the person that rooms you that you would like to check your cuff against our office readings. We recommend upper arm BP cuffs over that of the wrist. Measure your BP at the same time each day.  Do not measure BP right after you wake up and try to take it before morning medications and after. Take BP before exercising. Avoid caffeine, tobacco, and alcohol for 30 minutes before taking a measurement. Sit quietly for five minutes in a comfortable position with your legs and ankles uncrossed and back supported. Have your arm supported and at the level of your heart. Always use the same arm when taking your blood pressure. Place the cuff over bare skin rather than clothing. If needed, take a repeat BP reading by waiting 1-3 minutes after the first reading. Blood pressure varies often throughout the day with higher readings in the morning. BP may also be lower at home than in the office.   It is helpful  to document the time of each BP reading, as well as any activity or medications taken around the reading. In addition, it is helpful to include HR. Please bring your BP log into the office.   If your blood pressure is consistently elevated with top number above 180 and bottom above 120, this can damage the body. If you have severe increase in your blood pressure or concerning symptoms of severe chest pain, headache with confusion and blurred vision, severe  abdominal or back pain, SOB, seizures, or LOC, go to the ED.   Weights: Your dry weight will be what your scale says on the day you return home.  If you gain more than 3 pounds from dry weight in 1 day or greater than 5 pounds in 2 days and associated with shortness of breath or swelling, contact the office for further assistance.   Heart-Healthy Eating Plan Heart-healthy meal planning includes:  Eating less unhealthy fats.  Eating more healthy fats.  Making other changes in your diet. Talk with your doctor or a diet specialist (dietitian) to create an eating plan that is right for you. What is my plan? Your doctor may recommend an eating plan that includes:  Total fat: ______% or less of total calories a day.  Saturated fat: ______% or less of total calories a day.  Cholesterol: less than _________mg a day. What are tips for following this plan? Cooking Avoid frying your food. Try to bake, boil, grill, or broil it instead. You can also reduce fat by:  Removing the skin from poultry.  Removing all visible fats from meats.  Steaming vegetables in water or broth. Meal planning  At meals, divide your plate into four equal parts: ? Fill one-half of your plate with vegetables and green salads. ? Fill one-fourth of your plate with whole grains. ? Fill one-fourth of your plate with lean protein foods.  Eat 4-5 servings of vegetables per day. A serving of vegetables is: ? 1 cup of raw or cooked vegetables. ? 2 cups of raw leafy greens.  Eat 4-5 servings of fruit per day. A serving of fruit is: ? 1 medium whole fruit. ?  cup of dried fruit. ?  cup of fresh, frozen, or canned fruit. ?  cup of 100% fruit juice.  Eat more foods that have soluble fiber. These are apples, broccoli, carrots, beans, peas, and barley. Try to get 20-30 g of fiber per day.  Eat 4-5 servings of nuts, legumes, and seeds per week: ? 1 serving of dried beans or legumes equals  cup after being  cooked. ? 1 serving of nuts is  cup. ? 1 serving of seeds equals 1 tablespoon.   General information  Eat more home-cooked food. Eat less restaurant, buffet, and fast food.  Limit or avoid alcohol.  Limit foods that are high in starch and sugar.  Avoid fried foods.  Lose weight if you are overweight.  Keep track of how much salt (sodium) you eat. This is important if you have high blood pressure. Ask your doctor to tell you more about this.  Try to add vegetarian meals each week. Fats  Choose healthy fats. These include olive oil and canola oil, flaxseeds, walnuts, almonds, and seeds.  Eat more omega-3 fats. These include salmon, mackerel, sardines, tuna, flaxseed oil, and ground flaxseeds. Try to eat fish at least 2 times each week.  Check food labels. Avoid foods with trans fats or high amounts of saturated fat.  Limit saturated fats. ?  These are often found in animal products, such as meats, butter, and cream. ? These are also found in plant foods, such as palm oil, palm kernel oil, and coconut oil.  Avoid foods with partially hydrogenated oils in them. These have trans fats. Examples are stick margarine, some tub margarines, cookies, crackers, and other baked goods. What foods can I eat? Fruits All fresh, canned (in natural juice), or frozen fruits. Vegetables Fresh or frozen vegetables (raw, steamed, roasted, or grilled). Green salads. Grains Most grains. Choose whole wheat and whole grains most of the time. Rice and pasta, including brown rice and pastas made with whole wheat. Meats and other proteins Lean, well-trimmed beef, veal, pork, and lamb. Chicken and [place] without skin. All fish and shellfish. Wild duck, rabbit, pheasant, and venison. Egg whites or low-cholesterol egg substitutes. Dried beans, peas, lentils, and tofu. Seeds and most nuts. Dairy Low-fat or nonfat cheeses, including ricotta and mozzarella. Skim or 1% milk that is liquid, powdered, or  evaporated. Buttermilk that is made with low-fat milk. Nonfat or low-fat yogurt. Fats and oils Non-hydrogenated (trans-free) margarines. Vegetable oils, including soybean, sesame, sunflower, olive, peanut, safflower, corn, canola, and cottonseed. Salad dressings or mayonnaise made with a vegetable oil. Beverages Mineral water. Coffee and tea. Diet carbonated beverages. Sweets and desserts Sherbet, gelatin, and fruit ice. Small amounts of dark chocolate. Limit all sweets and desserts. Seasonings and condiments All seasonings and condiments. The items listed above may not be a complete list of foods and drinks you can eat. Contact a dietitian for more options. What foods should I avoid? Fruits Canned fruit in heavy syrup. Fruit in cream or butter sauce. Fried fruit. Limit coconut. Vegetables Vegetables cooked in cheese, cream, or butter sauce. Fried vegetables. Grains Breads that are made with saturated or trans fats, oils, or whole milk. Croissants. Sweet rolls. Donuts. High-fat crackers, such as cheese crackers. Meats and other proteins Fatty meats, such as hot dogs, ribs, sausage, bacon, rib-eye roast or steak. High-fat deli meats, such as salami and bologna. Caviar. Domestic duck and goose. Organ meats, such as liver. Dairy Cream, sour cream, cream cheese, and creamed cottage cheese. Whole-milk cheeses. Whole or 2% milk that is liquid, evaporated, or condensed. Whole buttermilk. Cream sauce or high-fat cheese sauce. Yogurt that is made from whole milk. Fats and oils Meat fat, or shortening. Cocoa butter, hydrogenated oils, palm oil, coconut oil, palm kernel oil. Solid fats and shortenings, including bacon fat, salt pork, lard, and butter. Nondairy cream substitutes. Salad dressings with cheese or sour cream. Beverages Regular sodas and juice drinks with added sugar. Sweets and desserts Frosting. Pudding. Cookies. Cakes. Pies. Milk chocolate or white chocolate. Buttered syrups. Full-fat  ice cream or ice cream drinks. The items listed above may not be a complete list of foods and drinks to avoid. Contact a dietitian for more information. Summary  Heart-healthy meal planning includes eating less unhealthy fats, eating more healthy fats, and making other changes in your diet.  Eat a balanced diet. This includes fruits and vegetables, low-fat or nonfat dairy, lean protein, nuts and legumes, whole grains, and heart-healthy oils and fats. This information is not intended to replace advice given to you by your health care provider. Make sure you discuss any questions you have with your health care provider. Document Revised: 07/29/2017 Document Reviewed: 07/02/2017 Elsevier Patient Education  2021 [employer].

## 2020-08-08 NOTE — Telephone Encounter (Signed)
Patient was in the office for a visit today and states he did not receive the eliquis discount card that was discussed. Patients daghter asked if this may be mailed to her at  [address] [place]  [phone]  Please advise

## 2020-08-09 ENCOUNTER — Telehealth: Payer: Self-pay | Admitting: *Deleted

## 2020-08-09 DIAGNOSIS — I4891 Unspecified atrial fibrillation: Secondary | ICD-10-CM

## 2020-08-09 DIAGNOSIS — I10 Essential (primary) hypertension: Secondary | ICD-10-CM

## 2020-08-09 DIAGNOSIS — I5031 Acute diastolic (congestive) heart failure: Secondary | ICD-10-CM

## 2020-08-09 DIAGNOSIS — Z79899 Other long term (current) drug therapy: Secondary | ICD-10-CM

## 2020-08-09 MED ORDER — APIXABAN 5 MG PO TABS: 5.0000 mg | ORAL_TABLET | Freq: Two times a day (BID) | ORAL | 1 refills | Status: AC

## 2020-08-09 NOTE — Telephone Encounter (Signed)
Prescription refill request for Eliquis received. Indication: a fib Last office visit: 08/08/20 Scr: 1.28 Age: 87 Weight: 84 kg

## 2020-08-09 NOTE — Telephone Encounter (Signed)
Spoke to pt's daughter Juliann Pulse [facility]), notified that I am going to call in the Eliquis 30-day free trial card info to pt's pharmacy, and will not have to mail it. Juliann Pulse appreciative and voices understanding. Juliann Pulse requests that we call this in to [facility] in [place].   Calling in info below: RxBIN: [id number] GRP: [id number] RxPCN: [id number] ID: [id number]

## 2020-08-09 NOTE — Telephone Encounter (Signed)
Spoke to pt's daughter, Juliann Pulse [facility]), notified of lab results and provider's recc.  Juliann Pulse verbalized understanding. Losartan has not been sent in, she understands will NOT start at this time.   She confirms that pt will take last dose of Prednisone tomorrow and this will be the end of taper.  Torsemide 10mg  daily has been sent to pt's pharmacy - [facility] in [place].  Orders placed for repeat Bmet to be done in 1 week after starting Torsemide.  Juliann Pulse asks that these labs be done at pt's PCP office.  Notified this is ok and orders faxed to Dr. Gale Journey @ [phone].  See other note for details regarding Eliquis 30 day card - was sent in to [facility].  Magda Paganini at [facility] states that pt will need refills sent for Eliquis prior to drug card going through.  Will send refill request to anti-coag clinic pool to refill Eliquis.   Kathy/Pt have no further questions or needs at this time.

## 2020-08-09 NOTE — Telephone Encounter (Signed)
-----   Message from Arvil Chaco, PA-C sent at 08/08/2020  7:21 PM EST ----- Kidney labs show elevated potassium, as well as a slight bump in kidney function. --HOLD or do not start Losartan until we have these repeat labs.  BNP or fluid labs is elevated --Wean off of the prednisone as soon as possible per PCP. Will fax these labs to his PCP. --Start torsemide 10mg  daily. Take in the AM. This should help with his potassium levels and fluid.  --Repeat BMET within 1 week of start of torsemide. This is sooner than initially planned, but given his recent labs, I would prefer to recheck his kidneys and potassium within a week of the start of torsemide. He can still have this done at his PCP if more convenient.  Cholesterol labs were collected early but show LDL controlled at 64 with goal of 70.  Liver function normal. Bilirubin slightly elevated.  Recommend follow-up with PCP.  Blood counts stable after starting Eliquis.  Let's follow-up within the next month, given these labs.

## 2020-08-11 ENCOUNTER — Other Ambulatory Visit: Payer: Self-pay

## 2020-08-11 ENCOUNTER — Emergency Department: Payer: Medicare Other

## 2020-08-11 ENCOUNTER — Emergency Department
Admission: EM | Admit: 2020-08-11 | Discharge: 2020-08-11 | Disposition: A | Discharge status: Home / Self Care | Payer: Medicare Other | Attending: Emergency Medicine | Admitting: Emergency Medicine

## 2020-08-11 DIAGNOSIS — Z7901 Long term (current) use of anticoagulants: Secondary | ICD-10-CM | POA: Diagnosis not present

## 2020-08-11 DIAGNOSIS — R079 Chest pain, unspecified: Secondary | ICD-10-CM | POA: Diagnosis not present

## 2020-08-11 DIAGNOSIS — Z79899 Other long term (current) drug therapy: Secondary | ICD-10-CM | POA: Insufficient documentation

## 2020-08-11 DIAGNOSIS — I10 Essential (primary) hypertension: Secondary | ICD-10-CM | POA: Insufficient documentation

## 2020-08-11 DIAGNOSIS — I4891 Unspecified atrial fibrillation: Secondary | ICD-10-CM | POA: Diagnosis not present

## 2020-08-11 DIAGNOSIS — Z8546 Personal history of malignant neoplasm of prostate: Secondary | ICD-10-CM | POA: Diagnosis not present

## 2020-08-11 DIAGNOSIS — Z87891 Personal history of nicotine dependence: Secondary | ICD-10-CM | POA: Insufficient documentation

## 2020-08-11 LAB — CBC
HCT: 44.3 % (ref 39.0–52.0)
Hemoglobin: 14.8 g/dL (ref 13.0–17.0)
MCH: 34.3 pg — ABNORMAL HIGH (ref 26.0–34.0)
MCHC: 33.4 g/dL (ref 30.0–36.0)
MCV: 102.5 fL — ABNORMAL HIGH (ref 80.0–100.0)
Platelets: 244 10*3/uL (ref 150–400)
RBC: 4.32 MIL/uL (ref 4.22–5.81)
RDW: 14 % (ref 11.5–15.5)
WBC: 14.1 10*3/uL — ABNORMAL HIGH (ref 4.0–10.5)
nRBC: 0 % (ref 0.0–0.2)

## 2020-08-11 LAB — BASIC METABOLIC PANEL
Anion gap: 9 (ref 5–15)
BUN: 27 mg/dL — ABNORMAL HIGH (ref 8–23)
CO2: 24 mmol/L (ref 22–32)
Calcium: 9.2 mg/dL (ref 8.9–10.3)
Chloride: 104 mmol/L (ref 98–111)
Creatinine, Ser: 1.28 mg/dL — ABNORMAL HIGH (ref 0.61–1.24)
GFR, Estimated: 54 mL/min — ABNORMAL LOW (ref >60)
Glucose, Bld: 132 mg/dL — ABNORMAL HIGH (ref 70–99)
Potassium: 3.8 mmol/L (ref 3.5–5.1)
Sodium: 137 mmol/L (ref 135–145)

## 2020-08-11 LAB — TROPONIN I (HIGH SENSITIVITY): Troponin I (High Sensitivity): 16 ng/L (ref <18)

## 2020-08-11 NOTE — ED Provider Notes (Signed)
[facility] Emergency Department Provider Note   ____________________________________________   Event Date/Time   First MD Initiated Contact with Patient 08/11/20 1043     (approximate)  I have reviewed the triage vital signs and the nursing notes.   HISTORY  Chief Complaint Chest Pain    HPI Jeremiah Lewis is a 87 y.o. male with stated past medical history of persistent atrial fibrillation diagnosed 2 weeks prior to arrival who presents via EMS for chest pressure that began last night and has resolved since onset.  Patient denies any exacerbating or relieving factors for this chest pressure.  Patient currently denies any vision changes, tinnitus, difficulty speaking, facial droop, sore throat, chest pain, shortness of breath, abdominal pain, nausea/vomiting/diarrhea, dysuria, or weakness/numbness/paresthesias in any extremity         Past Medical History:  Diagnosis Date  . Elevated PSA   . Gout   . Hypertension   . Hypertension   . Prostate cancer [facility])    prostate  . Prostatitis   . 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 Frequent PVCs   . Chest pain 07/28/2020  . Unspecified atrial fibrillation ([place]) 07/28/2020  . Malignant neoplasm of prostate ([place]) 05/22/2015  . HTN (hypertension) 01/23/2015  . Cardiac murmur 01/23/2015    Past Surgical History:  Procedure Laterality Date  . HEMORROIDECTOMY    . PROSTATE BIOPSY    . PROSTATE CRYOABLATION  2006    Prior to Admission medications   Medication Sig Start Date End Date Taking? Authorizing Provider  apixaban (ELIQUIS) 5 MG TABS tablet Take 1 tablet (5 mg total) by mouth 2 (two) times daily. 08/09/20   Marrianne Mood D, PA-C  atorvastatin (LIPITOR) 10 MG tablet Take 1 tablet (10 mg total) by mouth daily. 07/30/20   Max Sane, MD  diclofenac Sodium (VOLTAREN) 1 % GEL Apply 2 g topically 4 (four) times daily. 07/31/20 07/31/21  [provider]  metoprolol tartrate (LOPRESSOR) 25 MG tablet Take 1 tablet (25 mg total) by mouth 2 (two) times daily. 07/29/20   Max Sane, MD  predniSONE (DELTASONE) 20 MG tablet Take 1 tablet in morning for 5 days then 1/2 tablet for 5 days. Repeat as needed for acute arthritic pain. 07/31/20   [provider]  torsemide (DEMADEX) 20 MG tablet Take 0.5 tablets (10 mg total) by mouth daily. 08/08/20   Marrianne Mood D, PA-C    Allergies Patient has no known allergies.  Family History  Problem Relation Age of Onset  . Hypertension Other   . Prostate cancer Neg Hx   . Bladder Cancer Neg Hx     Social History Social History   Tobacco Use  . Smoking status: Former Research scientist (life sciences)  . Smokeless tobacco: Never Used  Substance Use Topics  . Alcohol use: Yes    Comment: beer on most evenings  . Drug use: No    Review of Systems Constitutional: No fever/chills Eyes: No visual changes. ENT: No sore throat. Cardiovascular: Endorses chest pain. Respiratory: Denies shortness of breath. Gastrointestinal: No abdominal pain.  No nausea, no vomiting.  No diarrhea. Genitourinary: Negative for dysuria. Musculoskeletal: Negative for acute arthralgias Skin: Negative for rash. Neurological: Negative for headaches, weakness/numbness/paresthesias in any extremity Psychiatric: Negative for suicidal ideation/homicidal ideation   ____________________________________________   PHYSICAL EXAM:  VITAL SIGNS: ED Triage Vitals  Enc Vitals Group     BP 08/11/20 1049 (!) 156/64  Pulse Rate 08/11/20 1049 90     Resp 08/11/20 1049 18     Temp 08/11/20 1049 98.3 F (36.8 C)     Temp Source 08/11/20 1049 Oral     SpO2 08/11/20 1049 96 %     Weight 08/11/20 1046 182 lb (82.6 kg)     Height 08/11/20 1046 6\' 2"  (1.88 m)     Head Circumference --      Peak Flow --      Pain Score 08/11/20 1046 0     Pain Loc --      Pain Edu? --      Excl. in [place]? --    Constitutional: Alert and oriented.  Well appearing and in no acute distress. Eyes: Conjunctivae are normal. PERRL. Head: Atraumatic. Nose: No congestion/rhinnorhea. Mouth/Throat: Mucous membranes are moist. Neck: No stridor Cardiovascular: Grossly normal heart sounds.  Good peripheral circulation. Respiratory: Normal respiratory effort.  No retractions. Gastrointestinal: Soft and nontender. No distention. Musculoskeletal: No obvious deformities Neurologic:  Normal speech and language. No gross focal neurologic deficits are appreciated. Skin:  Skin is warm and dry. No rash noted. Psychiatric: Mood and affect are normal. Speech and behavior are normal.  ____________________________________________   LABS (all labs ordered are listed, but only abnormal results are displayed)  Labs Reviewed  BASIC METABOLIC PANEL - Abnormal; Notable for the following components:      Result Value   Glucose, Bld 132 (*)    BUN 27 (*)    Creatinine, Ser 1.28 (*)    GFR, Estimated 54 (*)    All other components within normal limits  CBC - Abnormal; Notable for the following components:   WBC 14.1 (*)    MCV 102.5 (*)    MCH 34.3 (*)    All other components within normal limits  TROPONIN I (HIGH SENSITIVITY)   ____________________________________________  EKG  ED ECG REPORT I, Naaman Plummer, the attending physician, personally viewed and interpreted this ECG.  Date: 08/11/2020 EKG Time: 1048 Rate: 75 Rhythm: Atrial fibrillation QRS Axis: normal Intervals: normal ST/T Wave abnormalities: normal Narrative Interpretation: no evidence of acute ischemia, multiple PVCs  ____________________________________________  RADIOLOGY  ED MD interpretation: 2 view chest x ray shows no evidence of acute abnormalities including no pneumonia, pneumothorax, or widened mediastinum  Official radiology report(s): DG Chest 2 View  Result Date: 08/11/2020 CLINICAL DATA:  87 year old male with chest pain and atrial fibrillation. EXAM: CHEST -  2 VIEW COMPARISON:  None. FINDINGS: The heart size and mediastinal contours are within normal limits, unchanged. Both lungs are clear. No acute osseous abnormality. IMPRESSION: No acute cardiopulmonary process. Electronically Signed   By: Ruthann Cancer MD   On: 08/11/2020 11:21    ____________________________________________   PROCEDURES  Procedure(s) performed (including Critical Care):  .1-3 Lead EKG Interpretation Performed by: Naaman Plummer, MD Authorized by: Naaman Plummer, MD     Interpretation: normal     ECG rate:  77   ECG rate assessment: normal     Rhythm: sinus rhythm     Ectopy: none     Conduction: normal       ____________________________________________   INITIAL IMPRESSION / ASSESSMENT AND PLAN / ED COURSE  As part of my medical decision making, I reviewed the following data within the [facility] notes reviewed and incorporated, Labs reviewed, EKG interpreted, Old chart reviewed, Radiograph reviewed and Notes from prior ED visits reviewed and incorporated  Workup: ECG, CXR, CBC, BMP, Troponin Findings: ECG: No overt evidence of STEMI. No evidence of Brugadas sign, delta wave, epsilon wave, significantly prolonged QTc, or malignant arrhythmia HS Troponin: Negative x1 Other Labs unremarkable for emergent problems. CXR: Without PTX, PNA, or widened mediastinum Last Stress Test:  2017 Last Heart Catheterization:  2017 HEART Score: 4  Given History, Exam, and Workup I have low suspicion for ACS, Pneumothorax, Pneumonia, Pulmonary Embolus, Tamponade, Aortic Dissection or other emergent problem as a cause for this presentation.   Reassesment: Prior to discharge patients pain was controlled and they were well appearing.  Disposition:  Discharge. Strict return precautions discussed with patient with full understanding. Advised patient to follow up promptly with primary care provider       ____________________________________________   FINAL CLINICAL IMPRESSION(S) / ED DIAGNOSES  Final diagnoses:  Nonspecific chest pain     ED Discharge Orders    None       Note:  This document was prepared using Dragon voice recognition software and may include unintentional dictation errors.   Naaman Plummer, MD 08/11/20 [phone]

## 2020-08-11 NOTE — ED Triage Notes (Signed)
Pt arrives to ER via [facility]. Initially had CP that began last night, resolved at this time. Dx a fib 2 weeks ago, controlled in 60's. On metoprolol. A&O upon arrival.

## 2020-08-12 NOTE — Telephone Encounter (Signed)
Spoke with patient, states he will have his daughter Juliann Pulse call and set up appt

## 2020-08-13 NOTE — Addendum Note (Signed)
Addended by: Darlyne Russian on: 08/13/2020 11:43 AM   Modules accepted: Orders

## 2020-08-13 NOTE — Telephone Encounter (Signed)
Orders updated for pt to [facility]

## 2020-08-13 NOTE — Telephone Encounter (Signed)
After speaking with nurse regarding upcoming lab appointment, clarification was made that patient was unable to get her labs drawn at PCP. Patient was called and asked [facility]. The order will need to be changed by the nurse

## 2020-08-16 ENCOUNTER — Other Ambulatory Visit: Payer: Medicare Other

## 2020-08-16 ENCOUNTER — Other Ambulatory Visit: Payer: Self-pay

## 2020-08-16 ENCOUNTER — Other Ambulatory Visit
Admission: RE | Admit: 2020-08-16 | Discharge: 2020-08-16 | Disposition: A | Discharge status: Home / Self Care | Payer: Medicare Other | Source: Ambulatory Visit | Attending: Physician Assistant | Admitting: Physician Assistant

## 2020-08-16 DIAGNOSIS — Z79899 Other long term (current) drug therapy: Secondary | ICD-10-CM | POA: Diagnosis present

## 2020-08-16 DIAGNOSIS — I10 Essential (primary) hypertension: Secondary | ICD-10-CM | POA: Insufficient documentation

## 2020-08-16 DIAGNOSIS — I5031 Acute diastolic (congestive) heart failure: Secondary | ICD-10-CM | POA: Diagnosis present

## 2020-08-16 LAB — BASIC METABOLIC PANEL
Anion gap: 9 (ref 5–15)
BUN: 33 mg/dL — ABNORMAL HIGH (ref 8–23)
CO2: 24 mmol/L (ref 22–32)
Calcium: 9.1 mg/dL (ref 8.9–10.3)
Chloride: 101 mmol/L (ref 98–111)
Creatinine, Ser: 1.17 mg/dL (ref 0.61–1.24)
GFR, Estimated: >60 mL/min (ref >60)
Glucose, Bld: 175 mg/dL — ABNORMAL HIGH (ref 70–99)
Potassium: 4.5 mmol/L (ref 3.5–5.1)
Sodium: 134 mmol/L — ABNORMAL LOW (ref 135–145)

## 2020-08-21 ENCOUNTER — Other Ambulatory Visit: Payer: Self-pay | Admitting: *Deleted

## 2020-08-21 MED ORDER — METOPROLOL TARTRATE 25 MG PO TABS: 25.0000 mg | ORAL_TABLET | Freq: Two times a day (BID) | ORAL | 3 refills | Status: DC

## 2020-08-21 NOTE — Progress Notes (Signed)
Office Visit    Patient Name: Jeremiah Lewis Date of Encounter: 08/22/2020  PCP:  Romualdo Bolk, [place]  Cardiologist:  Kathlyn Sacramento, MD  Advanced Practice Provider:  No care team member to display Electrophysiologist:  None  :[id number]}   Chief Complaint    Chief Complaint  Patient presents with  . Follow-up    Needs refills for medications if no changes today per pt wife  Pt states doing well.  . Other    87 year old male with history of new onset atrial fibrillation, hypertension, HLD, short-term memory changes, daily alcohol use, prior tobacco use, gout, prostate cancer/prostatitis, rheumatoid arthritis, and who is being seen today for follow-up of volume status after recent initiation of torsemide.  Past Medical History    Past Medical History:  Diagnosis Date  . Elevated PSA   . Gout   . Hypertension   . Hypertension   . Prostate cancer [facility])    prostate  . Prostatitis   . Rheumatoid arteritis ([place])    Past Surgical History:  Procedure Laterality Date  . HEMORROIDECTOMY    . PROSTATE BIOPSY    . PROSTATE CRYOABLATION  2006    Allergies  No Known Allergies  History of Present Illness    Jeremiah Lewis is a 87 y.o. male with  PMH as above.  He normally follows with [facility] for his health.  The below HPI is somewhat limited by the patient's short-term memory issues.  On orientation questions, he is able to correctly state his location, age, name.  He states that he lives at home with his wife.  He denies a past medical history of heart disease or arrhythmia.  He has a remote history of tobacco use, quitting approximately 20 years ago.  He states he does drink alcohol daily - approximately 3 shots of liquor per day.  His father died of a heart attack at age 52.    He was recently seen at [facility] 07/28/2020 for CP over the several months and found to have atrial fibrillation.  CP was both exertional and nonexertional  and a tightness or pressure in the center of his chest that was pleuritic and worse with deep breathing. He denied palpitations, though these were noted noted in his EMR.  At presentation in the ED, he was found to have atrial fibrillation and elevated BP.  On exam, lower extremity edema noted, reported as chronic. In the ED, BP 162/56, HR 75 bpm.  High-sensitivity troponin 12, 11. Chest x-ray with mild diffuse interstitial airspace opacities which could represent multifocal infection versus pulmonary edema.  Echo obtained during admission showed normal LV SF, mild MR, moderate AR, mild tricuspid regurgitation, moderate pulmonary hypertension.  It was thought that his atrial fibrillation was likely permanent.  Recommendation was for anticoagulation with Eliquis 5 mg twice daily.  Metoprolol was increased to 25 mg twice daily.  It was noted that he was likely mildly volume overloaded; however, given he denied dyspnea, diuresis was deferred.  It was noted he could be reevaluated in the outpatient setting to consider small dose torsemide if needed.  Discharge weight 85.4kg,  On 2/23, he was seen by his PCP for possible UTI. He reported back pain. UA thought not consistent with UTI. He was started on prednisone 2/24 with recommendation for one 20mg  tablet x5d then 1/2 of the tablet for another 5 days and will thus be finished with his course of prednisone soon.  He is prescribed PRN Voltaren gel, which we reviewed today as not recommended from a cardiovascular standpoint.  When seen in clinic 08/08/2020, he was joined by his daughter.  EKG showed atrial fibrillation with frequent PVCs and ventricular rate 58 bpm.  No sx reported, including CP or sx of overload.  On exam,bilateral pitting edema, abdominal distention, and crackles noted. Labs showed BNP consistent with volume overload.  He was tolerating Eliquis well without any signs or symptoms of bleeding.  He was on prednisone for gout with recommendation to wean off  this steroid, as it was known for exacerbating volume status and ventricular rates.  Allopurinol and colchicine discussed as more optimal treatments for gout.  Also discussed was weaning off of Voltaren gel.  Reduced alcohol intake discussed, as he was reported as drinking 2-3 shots of alcohol per evening. He quit smoking 50 years ago and chewing tobacco 20 to 30 years ago and has not restarted.  He was drinking 1 cup of coffee every morning.  Wt 187 pounds or 84.8 kg with discharge weight 85.4 kg with consideration of different scales.  BP 164/66 with patient BP from home showing BP more controlled with home cuff.  We discussed bringing his home cuff into the office at his upcoming visits.  SBP 125-146 and DBP 60s to 80s.  Ventricular rate 50s to high 60s. He did state that he felt BP is higher in the office due to white coat or anxiety associated with seeing providers.  On 08/11/2020, he presented to the [facility] emergency department with report of chest pressure that started the previous night and had since resolved.  No clear exacerbating or alleviating factors.  No other symptoms reported.  BP 156/64 with ventricular rate 90.  Labs showed creatinine 1.28, BUN 27, WBC 14.1, high-sensitivity troponin 16.  Chest x-ray without significant findings.  EKG significant for atrial fibrillation with frequent PVCs.  No medication changes were made, and he was discharged from the ED.  Today, 08/22/2020, he returns to clinic and denies any recent chest pain or pressure with consideration of ED visit above.  He denies any racing heart rate, palpitations, presyncope, or syncope.  No recent falls. He reports that he is breathing well, as at past visits.  Ongoing lower extremity edema noted, though this continues not to bother the pt or be noticeable to him.  He continues to deny any signs or symptoms of volume overload. BP log brought into the office with BP improved since starting torsemide: SBP 146-1 12 and DBP 81-48.  Clinic  BP improved  BP  164/66 to 130/66. Also noted is ~10lb wt loss noted since starting torsemide after his 3/3 visit: 187 pounds  178 pounds today. They discussed transition from prednisone to allopurinol or colchicine as recommended with his PCP, and his PCP has stopped prednisone and started the pt on allopurinol.  Voltaren again discussed as not recommended with pt understanding and awareness, as we did discuss this at our last visit. When compared with his previous 3/3 visit, he does feel as if he has improved symptom wise.  He is joined again by his daughter, and she reports that he has not been eating or sleeping as well as in the past.  She suspects this may be due to the fact that his wife has been ill recently.  She worries about dehydration with recommendation for repeat BMET a PCP, which he is scheduled to see next Tuesday. We will collect lipid and liver labs at  that time as well. Current fluid intake was reviewed with estimated cup of 12 ounce coffee and small glass of milk in the morning, 16 ounces water with every meal, 1 shot of liquor each evening, and 4 ounces of Ensure to supplement his diet.  They are aware to call the office if any dizziness with low BP or decreased urine output.  As above, no dizziness at this time.  He also reports adequate urine output.  He has a part-time caregiver to assist with day-to-day care now, and given his wife is also ill at this time.    Home Medications    Current Outpatient Medications on File Prior to Visit  Medication Sig Dispense Refill  . Acetaminophen (TYLENOL ARTHRITIS PAIN PO) Take 650 mg by mouth 2 (two) times daily.    Marland Kitchen allopurinol (ZYLOPRIM) 100 MG tablet Take 1 tablet by mouth daily.    Marland Kitchen apixaban (ELIQUIS) 5 MG TABS tablet Take 1 tablet (5 mg total) by mouth 2 (two) times daily. 90 tablet 1  . atorvastatin (LIPITOR) 10 MG tablet Take 1 tablet (10 mg total) by mouth daily. 30 tablet 0  . diclofenac Sodium (VOLTAREN) 1 % GEL Apply 2 g topically  4 (four) times daily.    . metoprolol tartrate (LOPRESSOR) 25 MG tablet Take 1 tablet (25 mg total) by mouth 2 (two) times daily. 60 tablet 3  . torsemide (DEMADEX) 20 MG tablet Take 0.5 tablets (10 mg total) by mouth daily. 15 tablet 3   No current facility-administered medications on file prior to visit.    Review of Systems    He denies chest pain, palpitations, dyspnea, pnd, orthopnea, n, v, dizziness, syncope, weight gain, or early satiety. He reports chronic edema this visit that does not bother him. No s/sx of bleeding.  All other systems reviewed and are otherwise negative except as noted above.  Physical Exam    VS:  BP 130/66   Pulse 76   Ht 6\' 2"  (1.88 m)   Wt 178 lb (80.7 kg)   BMI 22.85 kg/m  , BMI Body mass index is 22.85 kg/m. GEN: Well nourished, well developed, in no acute distress.  Joined by his daughter. HEENT: normal. Neck: Supple, JVP ~9-10cm, carotid bruits, or masses. Cardiac:IRIR with extrasystole, 1/6 systolic murmur. No rubs, or gallops. No clubbing, cyanosis. 1+ bilateral edema.  Radials/DP/PT 2+ and equal bilaterally.  Respiratory: Slight work of breathing noted, trace bibasilar crackles. GI: Soft, nontender, nondistended, BS + x 4. MS: no deformity or atrophy. Skin: warm and dry, no rash. Neuro:  Strength and sensation are intact. Psych: Normal affect.  Accessory Clinical Findings    ECG personally reviewed by me today - Afib with controlled ventricular rate at 76 bpm, PVCs less frequent than prior tracings, IVCD, QTc 447.  Nonspecific TWI in lead I, as seen in prior tracings, and aVL, poor R wave progression as noted in prior EKGs- no acute changes.  VITALS Reviewed today   Temp Readings from Last 3 Encounters:  08/11/20 98.3 F (36.8 C) (Oral)  07/29/20 98.4 F (36.9 C)  07/30/18 98.2 F (36.8 C) (Oral)   BP Readings from Last 3 Encounters:  08/22/20 130/66  08/11/20 (!) 151/78  08/08/20 (!) 164/66   Pulse Readings from Last 3  Encounters:  08/22/20 76  08/11/20 78  08/08/20 (!) 58    Wt Readings from Last 3 Encounters:  08/22/20 178 lb (80.7 kg)  08/11/20 182 lb (82.6 kg)  08/08/20 187 lb (  84.8 kg)     07/30/2020 BP 134/81, 68 bpm, taken 1 hour after BP medications at 9:40 AM 07/31/2020 BP 132/84, 59 bpm, 9:15 AM 08/01/2020 BP 129/59, 51 bpm, 10:30 AM 08/03/2020 BP 146/69, 60 bpm, 2 PM 08/06/2020 BP 143/75, 52 bpm, 10:20 AM 08/09/2020 weight 187 08/11/2020 BP 138/48, 60 bpm, weight 182, 9 AM (BP elevated in ED of note) 08/14/2020 BP 112/63, 67 bpm, weight 182 4:20 PM 08/19/2020 BP 118/66, 80 bpm, 9 AM  LABS  reviewed today   Lab Results  Component Value Date   WBC 14.1 (H) 08/11/2020   HGB 14.8 08/11/2020   HCT 44.3 08/11/2020   MCV 102.5 (H) 08/11/2020   PLT 244 08/11/2020   Lab Results  Component Value Date   CREATININE 1.17 08/16/2020   BUN 33 (H) 08/16/2020   NA 134 (L) 08/16/2020   K 4.5 08/16/2020   CL 101 08/16/2020   CO2 24 08/16/2020   Lab Results  Component Value Date   ALT 28 08/08/2020   AST 26 08/08/2020   ALKPHOS 46 08/08/2020   BILITOT 1.2 08/08/2020   Lab Results  Component Value Date   CHOL 134 08/08/2020   HDL 54 08/08/2020   LDLCALC 64 08/08/2020   TRIG 79 08/08/2020   CHOLHDL 2.5 08/08/2020    Lab Results  Component Value Date   HGBA1C 4.9 07/28/2020   Lab Results  Component Value Date   TSH 2.167 07/28/2020     STUDIES/PROCEDURES reviewed today   Echo 07/29/20 1. Left ventricular ejection fraction, by estimation, is 55 to 60%. The  left ventricle has normal function. The left ventricle has no regional  wall motion abnormalities. There is mild left ventricular hypertrophy.  Left ventricular diastolic parameters  are indeterminate. The average left ventricular global longitudinal strain  is -10.0 %.  2. Right ventricular systolic function is normal. The right ventricular  size is normal. There is moderately elevated pulmonary artery systolic  pressure. The  estimated right ventricular systolic pressure is 31.5 mmHg.  3. Left atrial size was moderately dilated.  4. The mitral valve is normal in structure. Mild to moderate mitral valve  regurgitation. No evidence of mitral stenosis.  5. The aortic valve is abnormal. Aortic valve regurgitation is moderate.  Mild aortic valve stenosis.  6. The inferior vena cava is dilated in size with <50% respiratory  variability, suggesting right atrial pressure of 15 mmHg.   Assessment & Plan    Atrial fibrillation with controlled to bradycardic ventricular rate PVCs, asymptomatic --Asymptomatic.  Remains in atrial fibrillation with improved frequency of PVCs.  Ventricular rate well controlled.  Continue current metoprolol 25 mg twice daily for rate control.  Continue anticoagulation with Eliquis 5 mg twice daily given a CHA2DS2-VASc score of at least 3 (hypertension, age x2).  Avoid Voltaren gel, as previously discussed.  We discussed possible cardioversion at his previous visit; however, at this time and per pt preference, plan is for medical management only and as he is asx. Will defer for now. Of note, he will be therapeutically anticoagulated 08/25/20.  AOC diastolic heart failure --Denies s/sx of heart failure, though he remains volume up on exam. [facility] echo as above with nl LVSF and elevated RVSP with RAP 33mmHg.  No longer on prednisone as above.  Volume status improved today from previous visit, as well as symptoms per patient.  Wt and BP improved as above. Continue torsemide 10 mg daily.  Repeat BMET to be collected via PCP this  upcoming Tuesday, given report of reduced oral intake, though most recent Cr improved with diuresis. Reviewed the signs and symptoms of volume overload.  Discussed recommendation to remain hydrated but with all fluid under 2L daily and salt under 2g daily.  Chest pain, resolved --No current / recent reported CP. Per EMR, he was seen in the ED 08/11/2020 for nonspecific chest pain.   At the time of his ED visit, CP had resolved, and ED work-up was without significant findings. Given his volume status at that time, considered is that this could have been contributing to his sx. RF for CAD include prior history of tobacco, male, age, HTN, HLD. No previous MPI / LHC. 07/2020 echo showing nl EF and NRWMA. Will defer further ischemic workup at this time, given no recurrent sx, reassuring echo, and pt preference for medical management. Reassess sx at RTC. Call the office if any concerning sx. Continue current medications/aggressive risk factor modification. Will recheck lipids and LFTs at PCP next week as above.    Valvular dz --Asx. Recent echo as above with mild to moderate MR, moderate AR, mild AS. Periodic echo to monitor valvular function.   Hypertension --BP today improved from previous 164/66 to 130/66 with goal BP 130/80 or lower. Discussed salt and fluids. Remain hydrated with fluids under 2L daily and salt under 2g daily. Avoid NSAIDs as above.  Recommend bringing BP cuff to future appointments to compare with that of the office cuff. Will continue to defer addition of Losartan, as noted at prior visits.   EtOH  --1 shot of liquor per night.  Cessation recommended for risk factor modification.   HLD --Continue current atorvastatin 10 mg daily.  Will collect lipid and liver function with BMET at PCP.  Further recommendations, if indicated, at that time.  Medication changes: If indicated, pending labs. Labs ordered: BMET, lipid, liver to be collected per PCP.  Studies / Imaging ordered: None.  Future considerations: Periodic echo, statin adjustment if needed Disposition: RTC as scheduled in June 2022    Arvil Chaco, Hershal Coria 08/22/2020

## 2020-08-22 ENCOUNTER — Ambulatory Visit: Payer: Medicare Other | Admitting: Physician Assistant

## 2020-08-22 ENCOUNTER — Other Ambulatory Visit: Payer: Self-pay

## 2020-08-22 ENCOUNTER — Encounter: Payer: Self-pay | Admitting: Physician Assistant

## 2020-08-22 VITALS — BP 130/66 | HR 76 | Ht 74.0 in | Wt 178.0 lb

## 2020-08-22 DIAGNOSIS — I4819 Other persistent atrial fibrillation: Secondary | ICD-10-CM

## 2020-08-22 DIAGNOSIS — I5031 Acute diastolic (congestive) heart failure: Secondary | ICD-10-CM

## 2020-08-22 DIAGNOSIS — E785 Hyperlipidemia, unspecified: Secondary | ICD-10-CM

## 2020-08-22 DIAGNOSIS — I493 Ventricular premature depolarization: Secondary | ICD-10-CM

## 2020-08-22 DIAGNOSIS — I080 Rheumatic disorders of both mitral and aortic valves: Secondary | ICD-10-CM

## 2020-08-22 DIAGNOSIS — Z87898 Personal history of other specified conditions: Secondary | ICD-10-CM

## 2020-08-22 DIAGNOSIS — I10 Essential (primary) hypertension: Secondary | ICD-10-CM

## 2020-08-22 MED ORDER — ATORVASTATIN CALCIUM 10 MG PO TABS: 10.0000 mg | ORAL_TABLET | Freq: Every day | ORAL | 3 refills | Status: AC

## 2020-08-22 MED ORDER — METOPROLOL TARTRATE 25 MG PO TABS: 25.0000 mg | ORAL_TABLET | Freq: Two times a day (BID) | ORAL | 3 refills | Status: AC

## 2020-08-22 NOTE — Patient Instructions (Signed)
Medication Instructions:  Your physician recommends that you continue on your current medications as directed. Please refer to the Current Medication list given to you today.  Continue taking Torsemide daily until we review repeat lab work.   Refills sent today for Lipitor and Metoprolol.   *If you need a refill on your cardiac medications before your next appointment, please call your pharmacy*   Lab Work:  Please have lab work completed at your primary care visit on Tuesday. (Orders given at last visit)   Testing/Procedures: None ordered   Follow-Up: At [facility], you and your health needs are our priority.  As part of our continuing mission to provide you with exceptional heart care, we have created designated Provider Care Teams.  These Care Teams include your primary Cardiologist (physician) and Advanced Practice Providers (APPs -  Physician Assistants and Nurse Practitioners) who all work together to provide you with the care you need, when you need it.  We recommend signing up for the patient portal called "MyChart".  Sign up information is provided on this After Visit Summary.  MyChart is used to connect with patients for Virtual Visits (Telemedicine).  Patients are able to view lab/test results, encounter notes, upcoming appointments, etc.  Non-urgent messages can be sent to your provider as well.   To learn more about what you can do with MyChart, go to [email or web address].    Your next appointment:   Keep follow up as scheduled in June with [place].   The format for your next appointment:   In Person

## 2020-08-29 ENCOUNTER — Other Ambulatory Visit: Payer: Self-pay | Admitting: Primary Care

## 2020-09-02 ENCOUNTER — Telehealth: Payer: Self-pay

## 2020-09-02 NOTE — Telephone Encounter (Signed)
Spoke with patient's wife Nunzio Cory to see if they would like to reschedule Palliative consult appointment. She stated that her mother was terminally ill and when she passed patient would be going to a Memory Care unit. I will cancel referral and notify PCP.

## 2020-09-09 ENCOUNTER — Encounter: Payer: Self-pay | Admitting: Cardiovascular Disease

## 2020-10-21 ENCOUNTER — Emergency Department
Admission: EM | Admit: 2020-10-21 | Discharge: 2020-10-22 | Disposition: A | Discharge status: Home / Self Care | Payer: Medicare Other | Attending: Emergency Medicine | Admitting: Emergency Medicine

## 2020-10-21 DIAGNOSIS — Z7901 Long term (current) use of anticoagulants: Secondary | ICD-10-CM | POA: Insufficient documentation

## 2020-10-21 DIAGNOSIS — Z87891 Personal history of nicotine dependence: Secondary | ICD-10-CM | POA: Diagnosis not present

## 2020-10-21 DIAGNOSIS — Z8546 Personal history of malignant neoplasm of prostate: Secondary | ICD-10-CM | POA: Diagnosis not present

## 2020-10-21 DIAGNOSIS — R451 Restlessness and agitation: Secondary | ICD-10-CM | POA: Insufficient documentation

## 2020-10-21 DIAGNOSIS — I10 Essential (primary) hypertension: Secondary | ICD-10-CM | POA: Insufficient documentation

## 2020-10-21 DIAGNOSIS — R001 Bradycardia, unspecified: Secondary | ICD-10-CM | POA: Insufficient documentation

## 2020-10-21 DIAGNOSIS — Z79899 Other long term (current) drug therapy: Secondary | ICD-10-CM | POA: Diagnosis not present

## 2020-10-21 LAB — CBC WITH DIFFERENTIAL/PLATELET
Abs Immature Granulocytes: 0.01 10*3/uL (ref 0.00–0.07)
Basophils Absolute: 0 10*3/uL (ref 0.0–0.1)
Basophils Relative: 0 %
Eosinophils Absolute: 0.2 10*3/uL (ref 0.0–0.5)
Eosinophils Relative: 4 %
HCT: 33.4 % — ABNORMAL LOW (ref 39.0–52.0)
Hemoglobin: 10.9 g/dL — ABNORMAL LOW (ref 13.0–17.0)
Immature Granulocytes: 0 %
Lymphocytes Relative: 18 %
Lymphs Abs: 0.9 10*3/uL (ref 0.7–4.0)
MCH: 32.4 pg (ref 26.0–34.0)
MCHC: 32.6 g/dL (ref 30.0–36.0)
MCV: 99.4 fL (ref 80.0–100.0)
Monocytes Absolute: 0.6 10*3/uL (ref 0.1–1.0)
Monocytes Relative: 11 %
Neutro Abs: 3.3 10*3/uL (ref 1.7–7.7)
Neutrophils Relative %: 67 %
Platelets: 128 10*3/uL — ABNORMAL LOW (ref 150–400)
RBC: 3.36 MIL/uL — ABNORMAL LOW (ref 4.22–5.81)
RDW: 15.3 % (ref 11.5–15.5)
WBC: 5 10*3/uL (ref 4.0–10.5)
nRBC: 0 % (ref 0.0–0.2)

## 2020-10-21 LAB — BASIC METABOLIC PANEL
Anion gap: 9 (ref 5–15)
BUN: 25 mg/dL — ABNORMAL HIGH (ref 8–23)
CO2: 25 mmol/L (ref 22–32)
Calcium: 9 mg/dL (ref 8.9–10.3)
Chloride: 105 mmol/L (ref 98–111)
Creatinine, Ser: 1.41 mg/dL — ABNORMAL HIGH (ref 0.61–1.24)
GFR, Estimated: 48 mL/min — ABNORMAL LOW (ref >60)
Glucose, Bld: 108 mg/dL — ABNORMAL HIGH (ref 70–99)
Potassium: 3.7 mmol/L (ref 3.5–5.1)
Sodium: 139 mmol/L (ref 135–145)

## 2020-10-21 NOTE — ED Provider Notes (Incomplete)
[facility] Emergency Department Provider Note   ____________________________________________   Event Date/Time   First MD Initiated Contact with Patient 10/21/20 2259     (approximate)  I have reviewed the triage vital signs and the nursing notes.   HISTORY  Chief Complaint Agitation    HPI Jeremiah Lewis is a 87 y.o. male with past medical history of dementia, hypertension, persistent atrial fibrillation on Eliquis, and rheumatoid arthritis who presents to the ED for agitation.  Patient resides at [facility] and per EMS was agitated and acting out earlier today.  Staff at the facility reported to EMS that he was running into staff with his walker and becoming angry.  He was given an unknown medication with improvement in his condition for approximately 1 hour, but when he again became agitated EMS was called to have patient further evaluated.  Patient is now calm and cooperative, denies any complaints.        Past Medical History:  Diagnosis Date  . Elevated PSA   . Gout   . Hypertension   . Hypertension   . Prostate cancer [facility])    prostate  . Prostatitis   . Rheumatoid arteritis [facility])     Patient Active Problem List   Diagnosis Date Noted  . Frequent PVCs   . Chest pain 07/28/2020  . Unspecified atrial fibrillation ([place]) 07/28/2020  . Malignant neoplasm of prostate ([place]) 05/22/2015  . HTN (hypertension) 01/23/2015  . Cardiac murmur 01/23/2015    Past Surgical History:  Procedure Laterality Date  . HEMORROIDECTOMY    . PROSTATE BIOPSY    . PROSTATE CRYOABLATION  2006    Prior to Admission medications   Medication Sig Start Date End Date Taking? Authorizing Provider  Acetaminophen (TYLENOL ARTHRITIS PAIN PO) Take 650 mg by mouth 2 (two) times daily.    [provider]  allopurinol (ZYLOPRIM) 100 MG tablet Take 1 tablet by mouth daily. 08/15/20 08/15/21  [provider]  apixaban (ELIQUIS) 5 MG TABS tablet Take 1  tablet (5 mg total) by mouth 2 (two) times daily. 08/09/20   Marrianne Mood D, PA-C  atorvastatin (LIPITOR) 10 MG tablet Take 1 tablet (10 mg total) by mouth daily. 08/22/20 08/17/21  Marrianne Mood D, PA-C  diclofenac Sodium (VOLTAREN) 1 % GEL Apply 2 g topically 4 (four) times daily. 07/31/20 07/31/21  [provider]  metoprolol tartrate (LOPRESSOR) 25 MG tablet Take 1 tablet (25 mg total) by mouth 2 (two) times daily. 08/22/20 08/17/21  Marrianne Mood D, PA-C  torsemide (DEMADEX) 20 MG tablet Take 0.5 tablets (10 mg total) by mouth daily. 08/08/20   Marrianne Mood D, PA-C    Allergies Patient has no known allergies.  Family History  Problem Relation Age of Onset  . Hypertension Other   . Prostate cancer Neg Hx   . Bladder Cancer Neg Hx     Social History Social History   Tobacco Use  . Smoking status: Former Research scientist (life sciences)  . Smokeless tobacco: Never Used  Substance Use Topics  . Alcohol use: Yes    Comment: beer on most evenings  . Drug use: No    Review of Systems *** Constitutional: No fever/chills Eyes: No visual changes. ENT: No sore throat. Cardiovascular: Denies chest pain. Respiratory: Denies shortness of breath. Gastrointestinal: No abdominal pain.  No nausea, no vomiting.  No diarrhea.  No constipation. Genitourinary: Negative for dysuria. Musculoskeletal: Negative for back pain. Skin: Negative for rash. Neurological: Negative for headaches, focal  weakness or numbness.  ____________________________________________   PHYSICAL EXAM:  VITAL SIGNS: ED Triage Vitals  Enc Vitals Group     BP 10/21/20 2202 (!) 145/45     Pulse --      Resp 10/21/20 2202 17     Temp 10/21/20 2202 98.3 F (36.8 C)     Temp Source 10/21/20 2202 Oral     SpO2 10/21/20 2202 97 %     Weight 10/21/20 2203 178 lb (80.7 kg)     Height 10/21/20 2203 6\' 1"  (1.854 m)     Head Circumference --      Peak Flow --      Pain Score 10/21/20 2203 0     Pain Loc --      Pain Edu?  --      Excl. in [place]? --    *** Constitutional: Alert and oriented. Eyes: Conjunctivae are normal. Head: Atraumatic. Nose: No congestion/rhinnorhea. Mouth/Throat: Mucous membranes are moist. Neck: Normal ROM Cardiovascular: Normal rate, regular rhythm. Grossly normal heart sounds. Respiratory: Normal respiratory effort.  No retractions. Lungs CTAB. Gastrointestinal: Soft and nontender. No distention. Genitourinary: deferred Musculoskeletal: No lower extremity tenderness nor edema. Neurologic:  Normal speech and language. No gross focal neurologic deficits are appreciated. Skin:  Skin is warm, dry and intact. No rash noted. Psychiatric: Mood and affect are normal. Speech and behavior are normal.  ____________________________________________   LABS (all labs ordered are listed, but only abnormal results are displayed)  Labs Reviewed  CBC WITH DIFFERENTIAL/PLATELET - Abnormal; Notable for the following components:      Result Value   RBC 3.36 (*)    Hemoglobin 10.9 (*)    HCT 33.4 (*)    Platelets 128 (*)    All other components within normal limits  URINALYSIS, COMPLETE (UACMP) WITH MICROSCOPIC  BASIC METABOLIC PANEL   ____________________________________________  EKG  ED ECG REPORT I, Blake Divine, the attending physician, personally viewed and interpreted this ECG.   Date: 10/21/2020  EKG Time: 22:08  Rate: 72  Rhythm: atrial fibrillation, frequent PVC  Axis: Normal  Intervals:Prolonged QT  ST&T Change: None   PROCEDURES  Procedure(s) performed (including Critical Care):  Procedures   ____________________________________________   INITIAL IMPRESSION / ASSESSMENT AND PLAN / ED COURSE        ***      ____________________________________________   FINAL CLINICAL IMPRESSION(S) / ED DIAGNOSES  Final diagnoses:  None     ED Discharge Orders    None       Note:  This document was prepared using Dragon voice recognition software and may  include unintentional dictation errors.

## 2020-10-21 NOTE — ED Triage Notes (Addendum)
Pt from [facility] via EMS with complaints of agitation. They called 911 and reported pt was very agitated and acting out, running into staff with his walker. Reports they gave pt an unknown med that lasted 1 hr but then he began again. When EMS arrived, pt was pleasant and cooperative. Arrived to ER same. He has no complaints at this time

## 2020-10-21 NOTE — ED Notes (Signed)
Assumed care of pt at 2300, denies pain or concerns. Urinal placed to catch UA. Pt HR 30s-50s, MD [place] aware. Breathing is regular and unlabored. Pt on bed alarm. Hx of dementia. Pt unable to state where he is or what happened today.

## 2020-10-21 NOTE — ED Provider Notes (Signed)
[facility] Emergency Department Provider Note   ____________________________________________   Event Date/Time   First MD Initiated Contact with Patient 10/21/20 2259     (approximate)  I have reviewed the triage vital signs and the nursing notes.   HISTORY  Chief Complaint Agitation    HPI Jeremiah Lewis is a 87 y.o. male with past medical history of dementia, hypertension, persistent atrial fibrillation on Eliquis, and rheumatoid arthritis who presents to the ED for agitation.  Patient resides at [facility] and per EMS was agitated and acting out earlier today.  Staff at the facility reported to EMS that he was running into staff with his walker and becoming angry.  He was given an unknown medication with improvement in his condition for approximately 1 hour, but when he again became agitated EMS was called to have patient further evaluated.  Patient is now calm and cooperative, denies any complaints.        Past Medical History:  Diagnosis Date  . Elevated PSA   . Gout   . Hypertension   . Hypertension   . Prostate cancer [facility])    prostate  . Prostatitis   . 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 Frequent PVCs   . Chest pain 07/28/2020  . Unspecified atrial fibrillation ([place]) 07/28/2020  . Malignant neoplasm of prostate ([place]) 05/22/2015  . HTN (hypertension) 01/23/2015  . Cardiac murmur 01/23/2015    Past Surgical History:  Procedure Laterality Date  . HEMORROIDECTOMY    . PROSTATE BIOPSY    . PROSTATE CRYOABLATION  2006    Prior to Admission medications   Medication Sig Start Date End Date Taking? Authorizing Provider  Acetaminophen (TYLENOL ARTHRITIS PAIN PO) Take 650 mg by mouth 2 (two) times daily.    [provider]  allopurinol (ZYLOPRIM) 100 MG tablet Take 1 tablet by mouth daily. 08/15/20 08/15/21  [provider]  apixaban (ELIQUIS) 5 MG TABS tablet Take 1  tablet (5 mg total) by mouth 2 (two) times daily. 08/09/20   Marrianne Mood D, PA-C  atorvastatin (LIPITOR) 10 MG tablet Take 1 tablet (10 mg total) by mouth daily. 08/22/20 08/17/21  Marrianne Mood D, PA-C  diclofenac Sodium (VOLTAREN) 1 % GEL Apply 2 g topically 4 (four) times daily. 07/31/20 07/31/21  [provider]  metoprolol tartrate (LOPRESSOR) 25 MG tablet Take 1 tablet (25 mg total) by mouth 2 (two) times daily. 08/22/20 08/17/21  Marrianne Mood D, PA-C  torsemide (DEMADEX) 20 MG tablet Take 0.5 tablets (10 mg total) by mouth daily. 08/08/20   Marrianne Mood D, PA-C    Allergies Patient has no known allergies.  Family History  Problem Relation Age of Onset  . Hypertension Other   . Prostate cancer Neg Hx   . Bladder Cancer Neg Hx     Social History Social History   Tobacco Use  . Smoking status: Former Research scientist (life sciences)  . Smokeless tobacco: Never Used  Substance Use Topics  . Alcohol use: Yes    Comment: beer on most evenings  . Drug use: No    Review of Systems  Constitutional: No fever/chills.  Positive for agitation. Eyes: No visual changes. ENT: No sore throat. Cardiovascular: Denies chest pain. Respiratory: Denies shortness of breath. Gastrointestinal: No abdominal pain.  No nausea, no vomiting.  No diarrhea.  No constipation. Genitourinary: Negative for dysuria. Musculoskeletal: Negative for back pain. Skin: Negative for rash. Neurological:  Negative for headaches, focal weakness or numbness.  ____________________________________________   PHYSICAL EXAM:  VITAL SIGNS: ED Triage Vitals  Enc Vitals Group     BP 10/21/20 2202 (!) 145/45     Pulse --      Resp 10/21/20 2202 17     Temp 10/21/20 2202 98.3 F (36.8 C)     Temp Source 10/21/20 2202 Oral     SpO2 10/21/20 2202 97 %     Weight 10/21/20 2203 178 lb (80.7 kg)     Height 10/21/20 2203 6\' 1"  (1.854 m)     Head Circumference --      Peak Flow --      Pain Score 10/21/20 2203 0     Pain  Loc --      Pain Edu? --      Excl. in [place]? --     Constitutional: Alert and oriented to person, place, time, but not situation. Eyes: Conjunctivae are normal. Head: Atraumatic. Nose: No congestion/rhinnorhea. Mouth/Throat: Mucous membranes are moist. Neck: Normal ROM Cardiovascular: Normal rate, regular rhythm. Grossly normal heart sounds. Respiratory: Normal respiratory effort.  No retractions. Lungs CTAB. Gastrointestinal: Soft and nontender. No distention. Genitourinary: deferred Musculoskeletal: No lower extremity tenderness nor edema. Neurologic:  Normal speech and language. No gross focal neurologic deficits are appreciated. Skin:  Skin is warm, dry and intact. No rash noted. Psychiatric: Mood and affect are normal. Speech and behavior are normal.  ____________________________________________   LABS (all labs ordered are listed, but only abnormal results are displayed)  Labs Reviewed  URINALYSIS, COMPLETE (UACMP) WITH MICROSCOPIC - Abnormal; Notable for the following components:      Result Value   Color, Urine YELLOW (*)    APPearance CLEAR (*)    All other components within normal limits  CBC WITH DIFFERENTIAL/PLATELET - Abnormal; Notable for the following components:   RBC 3.36 (*)    Hemoglobin 10.9 (*)    HCT 33.4 (*)    Platelets 128 (*)    All other components within normal limits  BASIC METABOLIC PANEL - Abnormal; Notable for the following components:   Glucose, Bld 108 (*)    BUN 25 (*)    Creatinine, Ser 1.41 (*)    GFR, Estimated 48 (*)    All other components within normal limits  MAGNESIUM   ____________________________________________  EKG  ED ECG REPORT I, Blake Divine, the attending physician, personally viewed and interpreted this ECG.   Date: 10/21/2020  EKG Time: 22:08  Rate: 72  Rhythm: atrial fibrillation, frequent PVC  Axis: Normal  Intervals:Prolonged QT  ST&T Change: None   PROCEDURES  Procedure(s) performed (including  Critical Care):  Procedures   ____________________________________________   INITIAL IMPRESSION / ASSESSMENT AND PLAN / ED COURSE       87 year-old male with past medical history of dementia, hypertension, persistent atrial fibrillation on Eliquis, and rheumatoid arthritis who presents to the ED for agitation noted by staff at his nursing facility.  Patient is calm and cooperative on arrival to the ED, appears to be at his baseline mental status with no focal neurologic deficits.  He denies any complaints at this time, noted to be bradycardic at times in atrial fibrillation with frequent PVCs.  Previous EKGs additionally show frequent PVCs, labs thus far show no electrolyte abnormality.  He does take metoprolol for atrial fibrillation and would likely benefit from decrease in dosing of metoprolol, but does not appear to be symptomatic related to bradycardia and has normal  blood pressure at this time.  Labs are unremarkable, UA shows no signs of infection.  Patient remains calm and cooperative and is appropriate for discharge back to nursing facility.  He will need to cut his metoprolol dose in half to 12.5 mg twice daily given bradycardia, however he remains asymptomatic with normal blood pressure.      ____________________________________________   FINAL CLINICAL IMPRESSION(S) / ED DIAGNOSES  Final diagnoses:  Agitation  Bradycardia     ED Discharge Orders    None       Note:  This document was prepared using Dragon voice recognition software and may include unintentional dictation errors.   Blake Divine, MD 10/22/20 [phone]

## 2020-10-22 ENCOUNTER — Other Ambulatory Visit: Payer: Self-pay

## 2020-10-22 LAB — URINALYSIS, COMPLETE (UACMP) WITH MICROSCOPIC
Bacteria, UA: NONE SEEN
Bilirubin Urine: NEGATIVE
Glucose, UA: NEGATIVE mg/dL
Hgb urine dipstick: NEGATIVE
Ketones, ur: NEGATIVE mg/dL
Leukocytes,Ua: NEGATIVE
Nitrite: NEGATIVE
Protein, ur: NEGATIVE mg/dL
Specific Gravity, Urine: 1.012 (ref 1.005–1.030)
Squamous Epithelial / HPF: NONE SEEN (ref 0–5)
pH: 6 (ref 5.0–8.0)

## 2020-10-22 LAB — MAGNESIUM: Magnesium: 1.9 mg/dL (ref 1.7–2.4)

## 2020-10-22 NOTE — ED Notes (Signed)
Pt attempted to void but does not have the urge to urinate. Bladder scan revealed 255ml of urine. Pt encourage to use urinal at this time and is made aware of possible need to straight catheterize in order to retrieve urine for UA. Side rails up x3, call bell within reach, door and curtain open for easier visualization of pt

## 2020-10-22 NOTE — ED Notes (Signed)
Metoprolol dose is due to be half on each administration - report given to [place] and med tech via phone at this time. Med Necessity form completed and transportation being arranged. Breathing regular and unlabored. Pt resting with eyes closed

## 2020-10-22 NOTE — Discharge Instructions (Signed)
Your heart rate was low in the ER today, which could be related to the metoprolol that you take.  You will need to cut your dose of metoprolol in half and take 12.5 mg twice daily.  Please schedule follow-up with your PCP or cardiologist for recheck of your heart rate.  Please return to the ER for any new or worsening symptoms.

## 2020-11-08 ENCOUNTER — Ambulatory Visit: Payer: Medicare Other | Admitting: Physician Assistant

## 2021-05-28 ENCOUNTER — Emergency Department
Admission: EM | Admit: 2021-05-28 | Discharge: 2021-05-28 | Disposition: A | Discharge status: Home / Self Care | Payer: Medicare Other | Attending: Emergency Medicine | Admitting: Emergency Medicine

## 2021-05-28 ENCOUNTER — Emergency Department: Payer: Medicare Other

## 2021-05-28 DIAGNOSIS — Z8546 Personal history of malignant neoplasm of prostate: Secondary | ICD-10-CM | POA: Insufficient documentation

## 2021-05-28 DIAGNOSIS — S0990XA Unspecified injury of head, initial encounter: Secondary | ICD-10-CM | POA: Diagnosis present

## 2021-05-28 DIAGNOSIS — Z23 Encounter for immunization: Secondary | ICD-10-CM | POA: Diagnosis not present

## 2021-05-28 DIAGNOSIS — Z79899 Other long term (current) drug therapy: Secondary | ICD-10-CM | POA: Diagnosis not present

## 2021-05-28 DIAGNOSIS — S0181XA Laceration without foreign body of other part of head, initial encounter: Secondary | ICD-10-CM | POA: Diagnosis not present

## 2021-05-28 DIAGNOSIS — I10 Essential (primary) hypertension: Secondary | ICD-10-CM | POA: Insufficient documentation

## 2021-05-28 DIAGNOSIS — W19XXXA Unspecified fall, initial encounter: Secondary | ICD-10-CM

## 2021-05-28 DIAGNOSIS — Z87891 Personal history of nicotine dependence: Secondary | ICD-10-CM | POA: Diagnosis not present

## 2021-05-28 DIAGNOSIS — W01198A Fall on same level from slipping, tripping and stumbling with subsequent striking against other object, initial encounter: Secondary | ICD-10-CM | POA: Diagnosis not present

## 2021-05-28 DIAGNOSIS — Z7901 Long term (current) use of anticoagulants: Secondary | ICD-10-CM | POA: Diagnosis not present

## 2021-05-28 MED ORDER — TETANUS-DIPHTH-ACELL PERTUSSIS 5-2.5-18.5 LF-MCG/0.5 IM SUSY
0.5000 mL | PREFILLED_SYRINGE | Freq: Once | INTRAMUSCULAR | Status: AC
  Administered 2021-05-28: 05:00:00 0.5 mL via INTRAMUSCULAR
  Filled 2021-05-28: qty 0.5

## 2021-05-28 NOTE — Discharge Instructions (Signed)
Derma clip may be removed in 5 days.  Return to the ER for worsening symptoms, persistent vomiting, lethargy or other concerns.

## 2021-05-28 NOTE — ED Provider Notes (Signed)
[facility] Emergency Department Provider Note   ____________________________________________   Event Date/Time   First MD Initiated Contact with Patient 05/28/21 0330     (approximate)  I have reviewed the triage vital signs and the nursing notes.   HISTORY  Chief Complaint Fall  Level V caveat: Limited by dementia  HPI Jeremiah Lewis is a 87 y.o. male brought to the ED via EMS from [facility] memory care unit status post unwitnessed fall with forehead laceration.  Patient takes Eliquis.  Reports tripping on the way to the bathroom.  States he struck his forehead on the dresser.  Denies LOC.  Denies headache, neck pain, chest pain, shortness of breath, abdominal pain, nausea, vomiting or dizziness.     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 Authorizing Provider  Acetaminophen (TYLENOL ARTHRITIS PAIN PO) Take 650 mg by mouth 2 (two) times daily.    [provider]  allopurinol (ZYLOPRIM) 100 MG tablet Take 1 tablet by mouth daily. 08/15/20 08/15/21  [provider]  apixaban (ELIQUIS) 5 MG TABS tablet Take 1 tablet (5 mg total) by mouth 2 (two) times daily. 08/09/20   Marrianne Mood D, PA-C  atorvastatin (LIPITOR) 10 MG tablet Take 1 tablet (10 mg total) by mouth daily. 08/22/20 08/17/21  Marrianne Mood D, PA-C   diclofenac Sodium (VOLTAREN) 1 % GEL Apply 2 g topically 4 (four) times daily. 07/31/20 07/31/21  [provider]  metoprolol tartrate (LOPRESSOR) 25 MG tablet Take 1 tablet (25 mg total) by mouth 2 (two) times daily. 08/22/20 08/17/21  Marrianne Mood D, PA-C  torsemide (DEMADEX) 20 MG tablet Take 0.5 tablets (10 mg total) by mouth daily. 08/08/20   Marrianne Mood D, PA-C    Allergies Patient has no known allergies.  Family History  Problem Relation Age of Onset   Hypertension Other    Prostate cancer Neg Hx    Bladder Cancer Neg Hx     Social History Social History   Tobacco Use   Smoking status: Former   Smokeless tobacco: Never  Substance Use Topics   Alcohol use: Yes    Comment: beer on most evenings   Drug use: No    Review of Systems  Constitutional: No fever/chills Eyes: No visual changes. ENT: No sore throat. Cardiovascular: Denies chest pain. Respiratory: Denies shortness of breath. Gastrointestinal: No abdominal pain.  No nausea, no vomiting.  No diarrhea.  No constipation. Genitourinary: Negative for dysuria. Musculoskeletal: Negative for back pain. Skin: Negative for rash. Neurological: Positive for head injury.  Negative for headaches, focal weakness or numbness.   ____________________________________________   PHYSICAL EXAM:  VITAL SIGNS: ED Triage Vitals  Enc Vitals Group     BP      Pulse  Resp      Temp      Temp src      SpO2      Weight      Height      Head Circumference      Peak Flow      Pain Score      Pain Loc      Pain Edu?      Excl. in [place]?     Constitutional: Alert and oriented.  Elderly appearing and in no acute distress. Eyes: Conjunctivae are normal. PERRL. EOMI. Head: 0.75 curvilinear forehead laceration, well approximated without active bleeding. Nose: Atraumatic. Mouth/Throat: Mucous membranes are fully dry.  No dental malocclusion.   Neck: No stridor.   Cardiovascular: Normal rate, regular  rhythm. Grossly normal heart sounds.  Good peripheral circulation. Respiratory: Normal respiratory effort.  No retractions. Lungs CTAB. Gastrointestinal: Soft and nontender. No distention. No abdominal bruits. No CVA tenderness. Musculoskeletal: Pelvis stable.  No lower extremity tenderness nor edema.  No joint effusions. Neurologic: Alert and oriented to person and place.  Normal speech and language. No gross focal neurologic deficits are appreciated.  Skin:  Skin is warm, dry and intact. No rash noted. Psychiatric: Mood and affect are normal. Speech and behavior are normal.  ____________________________________________   LABS (all labs ordered are listed, but only abnormal results are displayed)  Labs Reviewed - No data to display ____________________________________________  EKG  None ____________________________________________  RADIOLOGY I, Odesser Tourangeau J, personally viewed and evaluated these images (plain radiographs) as part of my medical decision making, as well as reviewing the written report by the radiologist.  ED MD interpretation: No ICH  Official radiology report(s): CT Head Wo Contrast  Result Date: 05/28/2021 CLINICAL DATA:  Head trauma, minor (Age >= 65y).  Unwitnessed fall. EXAM: CT HEAD WITHOUT CONTRAST TECHNIQUE: Contiguous axial images were obtained from the base of the skull through the vertex without intravenous contrast. COMPARISON:  None. FINDINGS: Brain: Normal anatomic configuration. Parenchymal volume loss is commensurate with the patient's age. Moderate periventricular white matter changes are present likely reflecting the sequela of small vessel ischemia. No abnormal intra or extra-axial mass lesion or fluid collection. No abnormal mass effect or midline shift. No evidence of acute intracranial hemorrhage or infarct. Ventricular size is normal. Cerebellum unremarkable. Vascular: No asymmetric hyperdense vasculature at the skull base. Skull: Intact  Sinuses/Orbits: Paranasal sinuses are clear. Left ocular lens has been removed. Orbits are otherwise unremarkable. Other: Mastoid air cells and middle ear cavities are clear. Mild left frontal soft tissue swelling is present. IMPRESSION: No acute intracranial injury.  No calvarial fracture. Electronically Signed   By: Fidela Salisbury M.D.   On: 05/28/2021 03:53    ____________________________________________   PROCEDURES  Procedure(s) performed (including Critical Care):  Marland KitchenMarland KitchenLaceration Repair  Date/Time: 05/28/2021 4:38 AM Performed by: Paulette Blanch, MD Authorized by: Paulette Blanch, MD   Consent:    Consent obtained:  Verbal   Consent given by:  Patient   Risks discussed:  Infection, pain, poor cosmetic result and poor wound healing Anesthesia:    Anesthesia method:  None Laceration details:    Location:  Face   Face location:  Forehead   Length (cm):  0.8   Depth (mm):  1 Exploration:    Hemostasis achieved with:  Direct pressure   Contaminated: no   Treatment:    Area cleansed with:  Saline   Amount of cleaning:  Standard   Irrigation solution:  Sterile saline  Irrigation method:  Syringe   Visualized foreign bodies/material removed: no   Skin repair:    Repair method: Dermaclip. Approximation:    Approximation:  Close Repair type:    Repair type:  Simple Post-procedure details:    Procedure completion:  Tolerated well, no immediate complications   ____________________________________________   INITIAL IMPRESSION / ASSESSMENT AND PLAN / ED COURSE  As part of my medical decision making, I reviewed the following data within the [facility] notes reviewed and incorporated and Notes from prior ED visits     87 year old male presenting with forehead laceration status post unwitnessed fall, on Eliquis.  Differential diagnosis includes but is not limited to [place], SDH, SAH, etc.  Will obtain CT head and repair laceration.  Clinical Course as of  05/28/21 0436  Wed May 28, 2021  0434 Updated patient on CT results.  Patient tolerated derma clip repair well.  Strict return precautions given.  Patient verbalizes understanding and agrees with plan of care. [JS]    Clinical Course User Index [JS] Paulette Blanch, MD     ____________________________________________   FINAL CLINICAL IMPRESSION(S) / ED DIAGNOSES  Final diagnoses:  Fall, initial encounter  Injury of head, initial encounter  Facial laceration, initial encounter     ED Discharge Orders     None        Note:  This document was prepared using Dragon voice recognition software and may include unintentional dictation errors.    Paulette Blanch, MD 05/28/21 [phone]

## 2021-05-28 NOTE — ED Triage Notes (Signed)
Pt comes via [facility] EMS from memory care at [facility], pt had an unwitnessed fall, hit head, laceration to forehead, on eliquis.

## 2021-12-06 DEATH — deceased

## 2022-05-07 IMAGING — DX DG CHEST 1V PORT
1 series · 1 of 1 positions shown · non-contrast
Comparison: 04/07/2015

CLINICAL DATA: Chest pain

EXAM:
PORTABLE CHEST 1 VIEW

[chest ap]
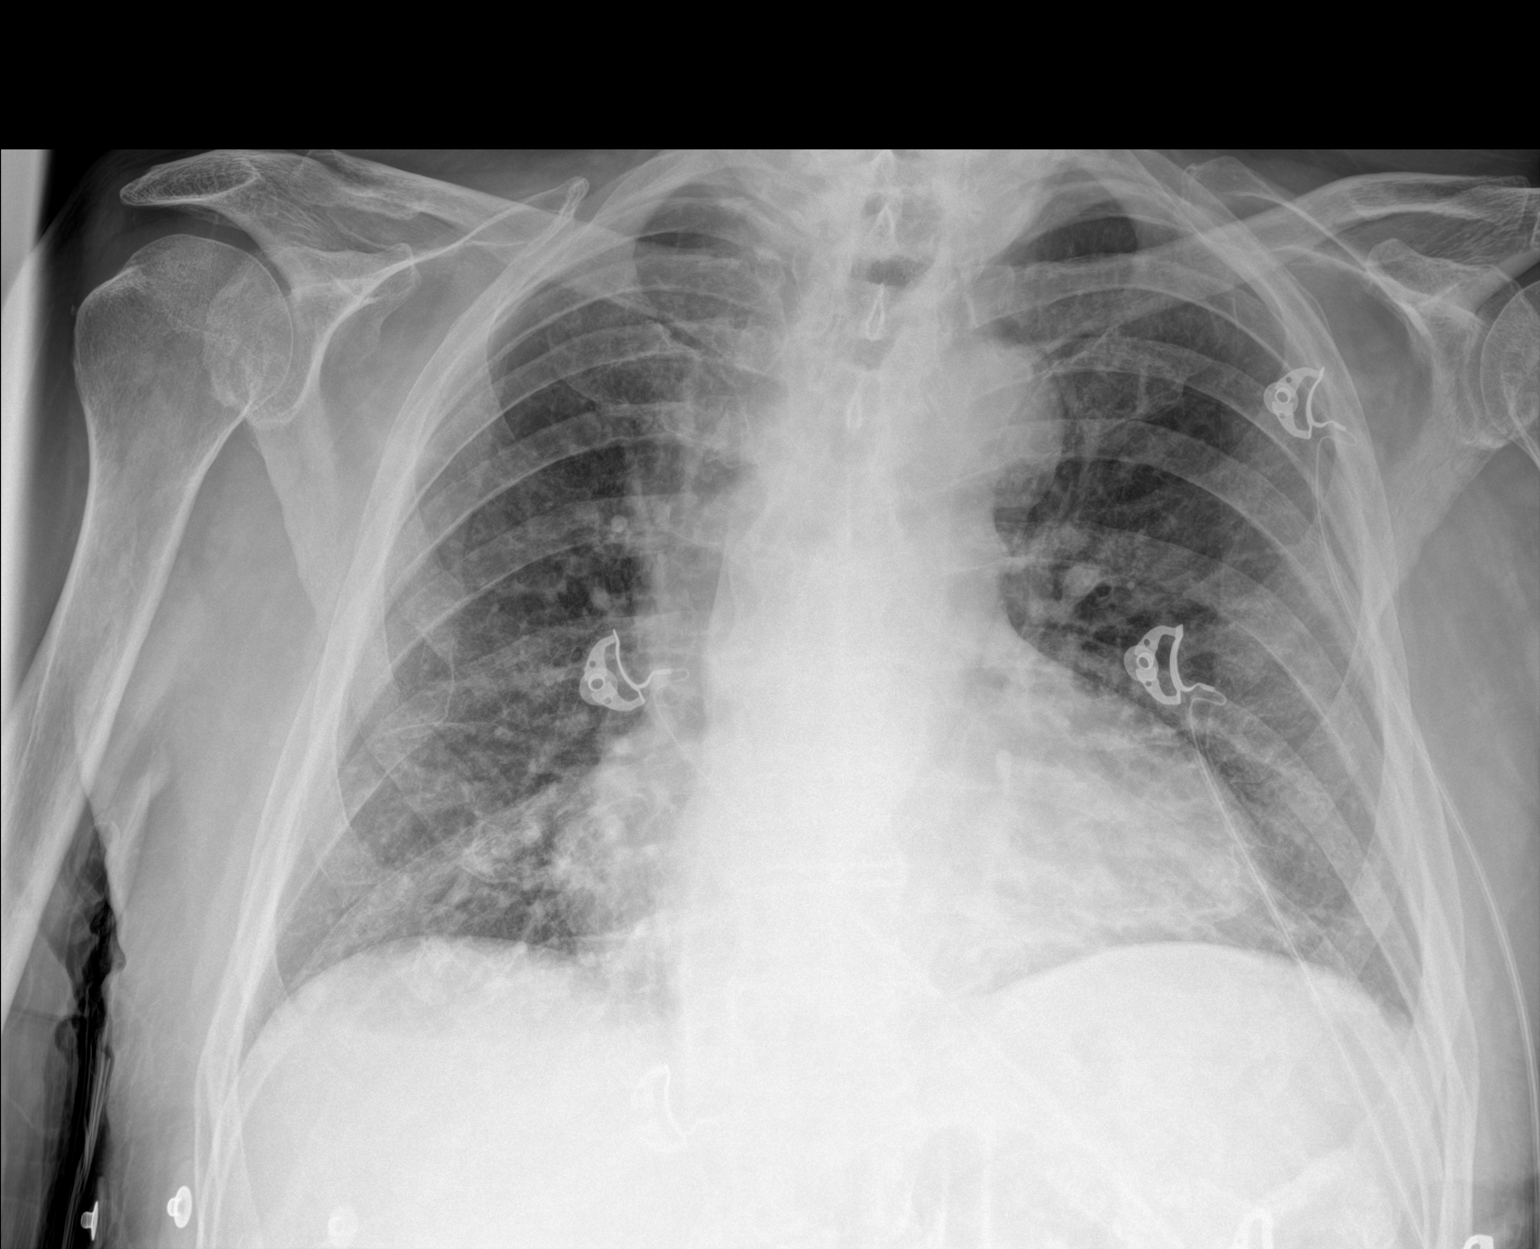

[1 of 1 positions shown; findings below may reference images not displayed]

FINDINGS: Mild cardiac enlargement. Decreased lung volumes. Mild diffuse
increase interstitial and airspace opacities identified bilaterally.
No pleural effusions. The visualized osseous structures are
unremarkable.
IMPRESSION: Mild diffuse increase interstitial and airspace opacities which may
represent multifocal infection versus pulmonary edema.

## 2022-05-21 IMAGING — CR DG CHEST 2V
2 series · 2 of 2 positions shown · non-contrast
Comparison: None.

CLINICAL DATA: 89-year-old male with chest pain and atrial
fibrillation.

EXAM:
CHEST - 2 VIEW

[chest lat]
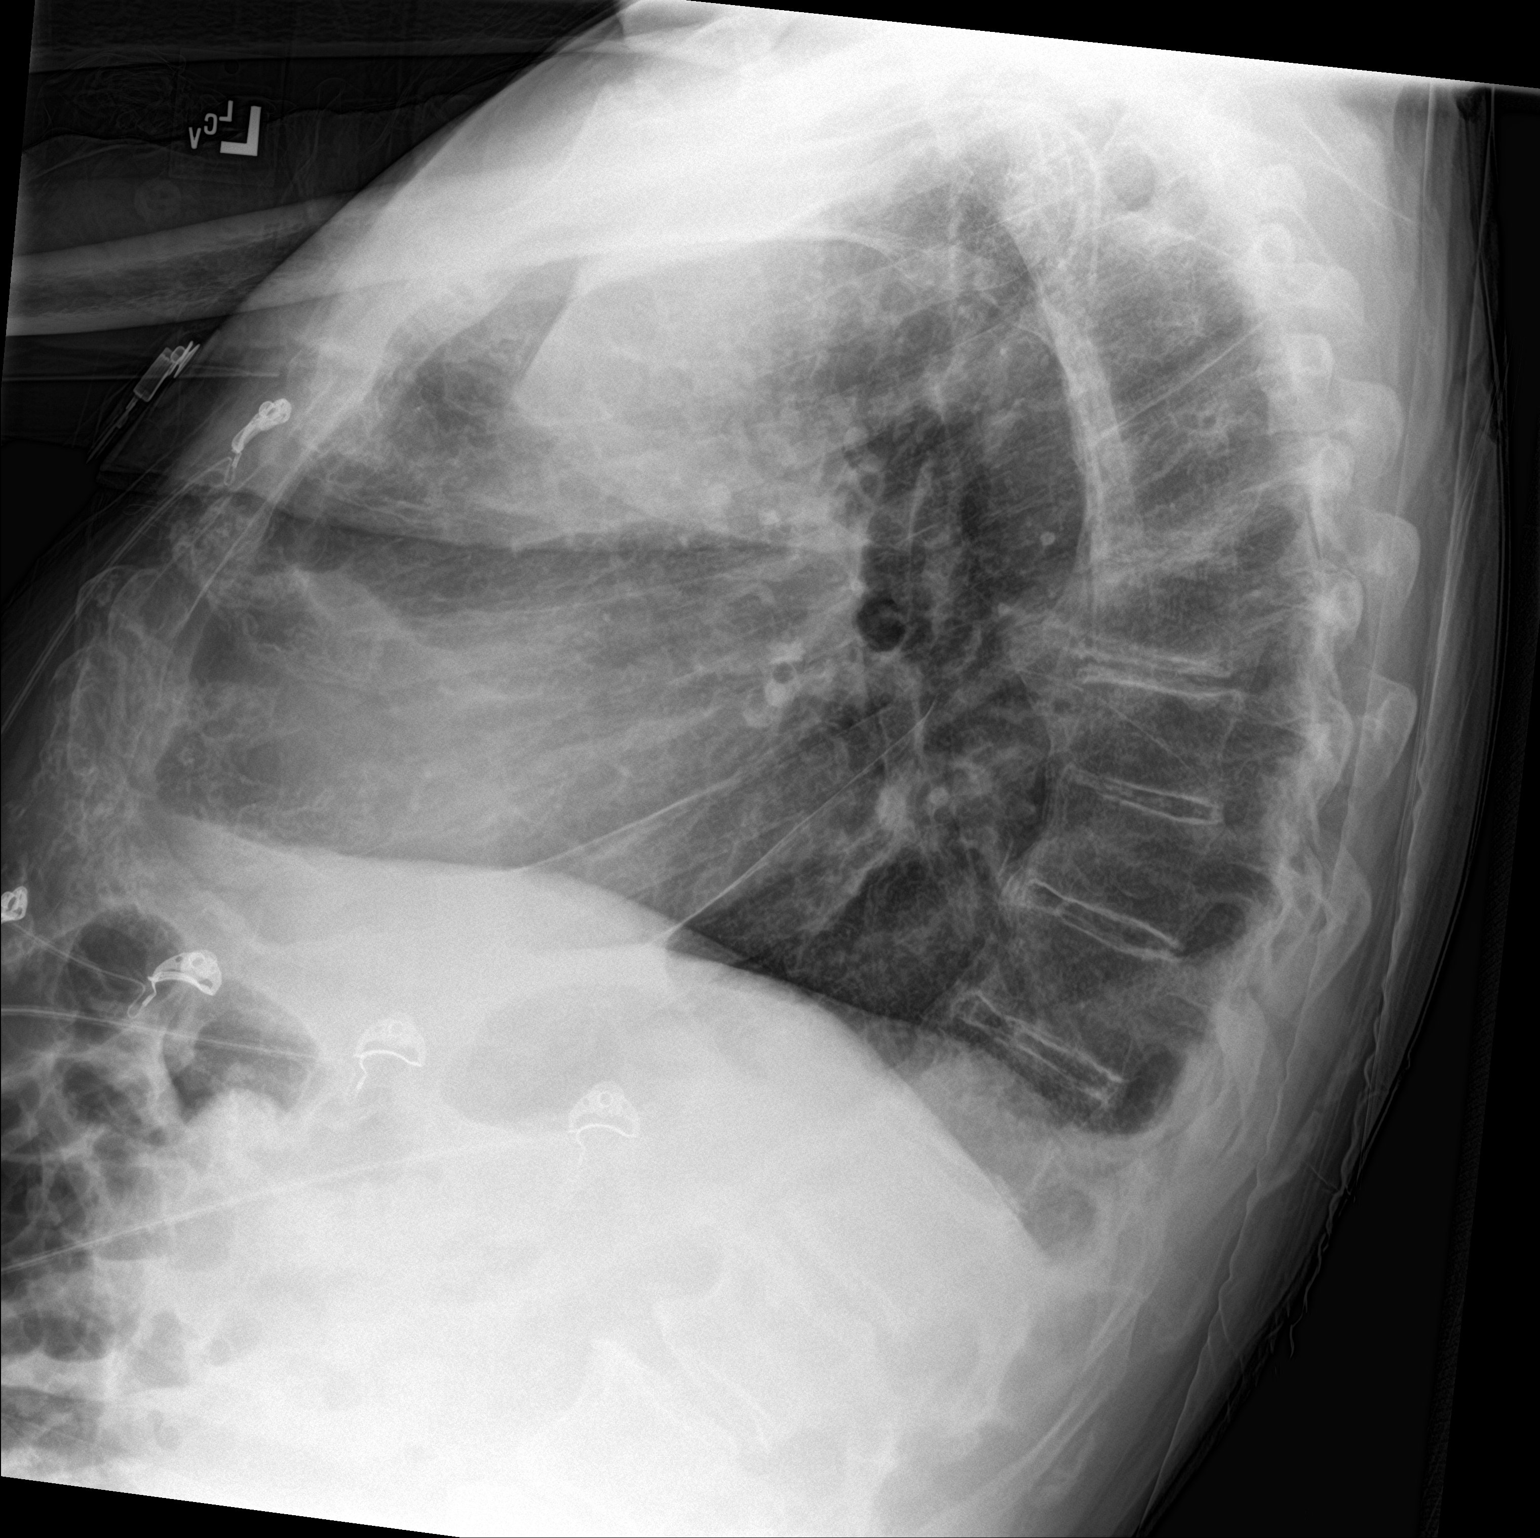

[chest ap]
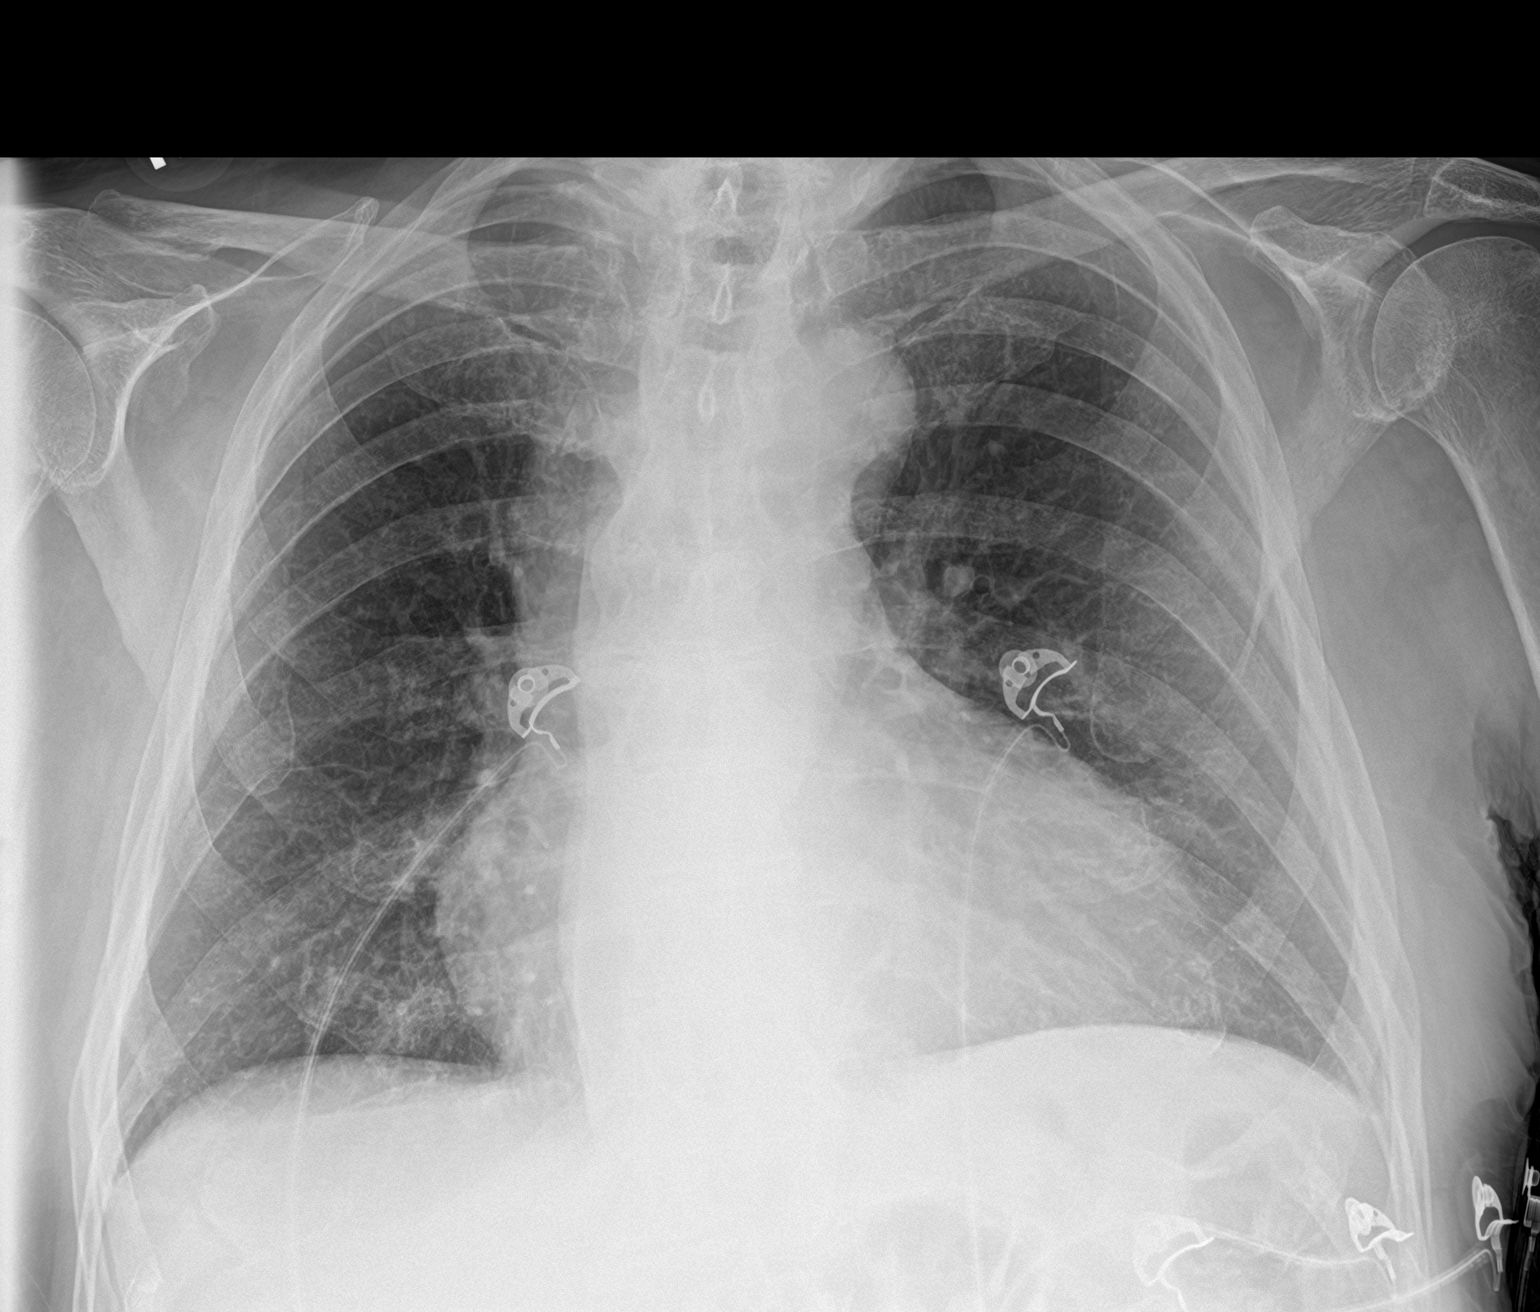

[2 of 2 positions shown; findings below may reference images not displayed]

FINDINGS: The heart size and mediastinal contours are within normal limits,
unchanged. Both lungs are clear. No acute osseous abnormality.
IMPRESSION: No acute cardiopulmonary process.
